# Patient Record
Sex: Female | Born: 2000 | Race: White | Hispanic: No | Marital: Single | Smoking: Never smoker
Health system: Southern US, Community
[De-identification: ages and names within clinical notes are randomized; demographics above are authoritative.]

## PROBLEM LIST (undated history)

## (undated) DIAGNOSIS — K219 Gastro-esophageal reflux disease without esophagitis: Secondary | ICD-10-CM

## (undated) DIAGNOSIS — G40909 Epilepsy, unspecified, not intractable, without status epilepticus: Secondary | ICD-10-CM

## (undated) DIAGNOSIS — Z8719 Personal history of other diseases of the digestive system: Secondary | ICD-10-CM

## (undated) DIAGNOSIS — F909 Attention-deficit hyperactivity disorder, unspecified type: Secondary | ICD-10-CM

## (undated) DIAGNOSIS — Z8711 Personal history of peptic ulcer disease: Secondary | ICD-10-CM

## (undated) DIAGNOSIS — Z8619 Personal history of other infectious and parasitic diseases: Secondary | ICD-10-CM

## (undated) DIAGNOSIS — R12 Heartburn: Secondary | ICD-10-CM

## (undated) HISTORY — DX: Epilepsy, unspecified, not intractable, without status epilepticus: G40.909

## (undated) HISTORY — DX: Attention-deficit hyperactivity disorder, unspecified type: F90.9

## (undated) HISTORY — DX: Personal history of peptic ulcer disease: Z87.11

## (undated) HISTORY — DX: Personal history of other diseases of the digestive system: Z87.19

## (undated) HISTORY — DX: Personal history of other infectious and parasitic diseases: Z86.19

## (undated) HISTORY — DX: Gastro-esophageal reflux disease without esophagitis: K21.9

## (undated) HISTORY — DX: Heartburn: R12

---
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
## 2018-01-30 ENCOUNTER — Ambulatory Visit (INDEPENDENT_AMBULATORY_CARE_PROVIDER_SITE_OTHER): Payer: Medicaid Other | Admitting: Family Medicine

## 2018-01-30 ENCOUNTER — Encounter: Payer: Self-pay | Admitting: Family Medicine

## 2018-01-30 DIAGNOSIS — K581 Irritable bowel syndrome with constipation: Secondary | ICD-10-CM | POA: Diagnosis not present

## 2018-01-30 MED ORDER — LUBIPROSTONE 8 MCG PO CAPS
8.0000 ug | ORAL_CAPSULE | Freq: Two times a day (BID) | ORAL | 1 refills | Status: DC
Start: 2018-01-30 — End: 2018-02-20

## 2018-01-30 NOTE — Progress Notes (Signed)
Patient: Jessica Hall Female    DOB: 2001-07-25   17 y.o.   MRN: [id number] Visit Date: 01/30/2018  Today's Provider: Mila Merry, MD   Chief Complaint  Patient presents with  . Abdominal Pain   Subjective:    Patient has had intermittent lower abdominal pain for about 2 months. Patient states usually after she eats, the pain will start in her lower abdominal region. Patient also has symptoms of gas, belching, diarrhea, constipation, and stomach cramping. Patient has gone off all her medications and now she's only taking otc stool softner.    Abdominal Pain  This is a new problem. The current episode started more than 1 month ago (2 months). The onset quality is gradual. The problem occurs constantly. The problem has been waxing and waning. The pain is located in the LLQ and RLQ. The pain is at a severity of 9/10. The pain is severe. The quality of the pain is sharp and cramping. The abdominal pain does not radiate. Associated symptoms include constipation, diarrhea and flatus. Pertinent negatives include no anorexia, arthralgias, belching, dysuria, fever, frequency, headaches, hematochezia, hematuria, melena, myalgias, nausea, vomiting or weight loss. The pain is aggravated by eating. The pain is relieved by bowel movements. Treatments tried: stool softner. The treatment provided mild relief. IBS  Had similar symptoms in 2017 ago attributed to IBS. She also had positive h. Pylori serology and was treated with triple therapy with some improvement in symptoms. She was also put on fiber supplement with minimal improvement.     No Known Allergies   Current Outpatient Medications:  .  amoxicillin (AMOXIL) 500 MG capsule, Take 2 capsules (1,000 mg total) by mouth 2 (two) times daily. (Patient not taking: Reported on 01/30/2018), Disp: 40 capsule, Rfl: 0 .  amphetamine-dextroamphetamine (ADDERALL XR) 15 MG 24 hr capsule, Take by mouth. Reported on 02/29/2016, Disp: , Rfl:  .   clarithromycin (BIAXIN) 500 MG tablet, Take 1 tablet (500 mg total) by mouth 2 (two) times daily. (Patient not taking: Reported on 01/30/2018), Disp: 20 tablet, Rfl: 0 .  clonazePAM (KLONOPIN) 0.5 MG tablet, Take by mouth. Reported on 02/29/2016, Disp: , Rfl:  .  omeprazole (PRILOSEC) 20 MG capsule, Take 1 capsule (20 mg total) by mouth daily. (Patient not taking: Reported on 01/30/2018), Disp: 20 capsule, Rfl: 0  Review of Systems  Constitutional: Negative for appetite change, chills, fatigue, fever and weight loss.  Respiratory: Negative for chest tightness and shortness of breath.   Cardiovascular: Negative for chest pain and palpitations.  Gastrointestinal: Positive for abdominal pain, constipation, diarrhea and flatus. Negative for anorexia, hematochezia, melena, nausea and vomiting.  Genitourinary: Negative for dysuria, frequency and hematuria.  Musculoskeletal: Negative for arthralgias and myalgias.  Neurological: Negative for dizziness, weakness and headaches.    Social History   Tobacco Use  . Smoking status: Never Smoker  . Smokeless tobacco: Never Used  Substance Use Topics  . Alcohol use: No    Alcohol/week: 0.0 oz   Objective:   BP 124/84 (BP Location: Right Arm, Patient Position: Sitting, Cuff Size: Normal)   Pulse 88   Temp 98.4 F (36.9 C) (Oral)   Resp 16   LMP 12/31/2017   SpO2 98%  Vitals:   01/30/18 1127  BP: 124/84  Pulse: 88  Resp: 16  Temp: 98.4 F (36.9 C)  TempSrc: Oral  SpO2: 98%     Physical Exam  General Appearance:    Alert, cooperative, no distress, obese  Eyes:    PERRL, conjunctiva/corneas clear, EOM's intact       Lungs:     Clear to auscultation bilaterally, respirations unlabored  Heart:    Regular rate and rhythm  Neurologic:   Awake, alert, oriented x 3. No apparent focal neurological           defect.           Assessment & Plan:     1. Irritable bowel syndrome with constipation  - lubiprostone (AMITIZA) 8 MCG capsule; Take  1 capsule (8 mcg total) by mouth 2 (two) times daily with a meal.  Dispense: 60 capsule; Refill: 1  Return in about 3 weeks (around 02/20/2018).       Mila Merryonald Kassady Laboy, MD  [facility]

## 2018-01-30 NOTE — Patient Instructions (Signed)
Irritable Bowel Syndrome, Adult Irritable bowel syndrome (IBS) is not one specific disease. It is a group of symptoms that affects the organs responsible for digestion (gastrointestinal or GI tract). To regulate how your GI tract works, your body sends signals back and forth between your intestines and your brain. If you have IBS, there Exton be a problem with these signals. As a result, your GI tract does not function normally. Your intestines Wyatt become more sensitive and overreact to certain things. This is especially true when you eat certain foods or when you are under stress. There are four types of IBS. These Musquiz be determined based on the consistency of your stool:  IBS with diarrhea.  IBS with constipation.  Mixed IBS.  Unsubtyped IBS.  It is important to know which type of IBS you have. Some treatments are more likely to be helpful for certain types of IBS. What are the causes? The exact cause of IBS is not known. What increases the risk? You Kiesel have a higher risk of IBS if:  You are a woman.  You are younger than 19 years old.  You have a family history of IBS.  You have mental health problems.  You have had bacterial infection of your GI tract.  What are the signs or symptoms? Symptoms of IBS vary from person to person. The main symptom is abdominal pain or discomfort. Additional symptoms usually include one or more of the following:  Diarrhea, constipation, or both.  Abdominal swelling or bloating.  Feeling full or sick after eating a small or regular-size meal.  Frequent gas.  Mucus in the stool.  A feeling of having more stool left after a bowel movement.  Symptoms tend to come and go. They Mccranie be associated with stress, psychiatric conditions, or nothing at all. How is this diagnosed? There is no specific test to diagnose IBS. Your health care provider will make a diagnosis based on a physical exam, medical history, and your symptoms. You Przybysz have other  tests to rule out other conditions that Villafranca be causing your symptoms. These Perot include:  Blood tests.  X-rays.  CT scan.  Endoscopy and colonoscopy. This is a test in which your GI tract is viewed with a long, thin, flexible tube.  How is this treated? There is no cure for IBS, but treatment can help relieve symptoms. IBS treatment often includes:  Changes to your diet, such as: ? Eating more fiber. ? Avoiding foods that cause symptoms. ? Drinking more water. ? Eating regular, medium-sized portioned meals.  Medicines. These Raval include: ? Fiber supplements if you have constipation. ? Medicine to control diarrhea (antidiarrheal medicines). ? Medicine to help control muscle spasms in your GI tract (antispasmodic medicines). ? Medicines to help with any mental health issues, such as antidepressants or tranquilizers.  Therapy. ? Talk therapy Farra help with anxiety, depression, or other mental health issues that can make IBS symptoms worse.  Stress reduction. ? Managing your stress can help keep symptoms under control.  Follow these instructions at home:  Take medicines only as directed by your health care provider.  Eat a healthy diet. ? Avoid foods and drinks with added sugar. ? Include more whole grains, fruits, and vegetables gradually into your diet. This Epperly be especially helpful if you have IBS with constipation. ? Avoid any foods and drinks that make your symptoms worse. These Lomas include dairy products and caffeinated or carbonated drinks. ? Do not eat large meals. ? Drink enough   fluid to keep your urine clear or pale yellow.  Exercise regularly. Ask your health care provider for recommendations of good activities for you.  Keep all follow-up visits as directed by your health care provider. This is important. Contact a health care provider if:  You have constant pain.  You have trouble or pain with swallowing.  You have worsening diarrhea. Get help right away  if:  You have severe and worsening abdominal pain.  You have diarrhea and: ? You have a rash, stiff neck, or severe headache. ? You are irritable, sleepy, or difficult to awaken. ? You are weak, dizzy, or extremely thirsty.  You have bright red blood in your stool or you have black tarry stools.  You have unusual abdominal swelling that is painful.  You vomit continuously.  You vomit blood (hematemesis).  You have both abdominal pain and a fever. This information is not intended to replace advice given to you by your health care provider. Make sure you discuss any questions you have with your health care provider. Document Released: 11/07/2005 Document Revised: 04/08/2016 Document Reviewed: 07/25/2014 Elsevier Interactive Patient Education  2018 Elsevier Inc.  

## 2018-02-02 DIAGNOSIS — K589 Irritable bowel syndrome without diarrhea: Secondary | ICD-10-CM | POA: Insufficient documentation

## 2018-02-20 ENCOUNTER — Encounter: Payer: Self-pay | Admitting: Family Medicine

## 2018-02-20 ENCOUNTER — Ambulatory Visit (INDEPENDENT_AMBULATORY_CARE_PROVIDER_SITE_OTHER): Payer: Medicaid Other | Admitting: Family Medicine

## 2018-02-20 VITALS — BP 120/80 | HR 50 | Temp 98.2°F | Resp 16 | Ht 65.0 in | Wt 163.0 lb

## 2018-02-20 DIAGNOSIS — K581 Irritable bowel syndrome with constipation: Secondary | ICD-10-CM

## 2018-02-20 DIAGNOSIS — Z1331 Encounter for screening for depression: Secondary | ICD-10-CM | POA: Diagnosis not present

## 2018-02-20 MED ORDER — LUBIPROSTONE 8 MCG PO CAPS: 8.0000 ug | ORAL_CAPSULE | Freq: Two times a day (BID) | ORAL | 1 refills | Status: DC

## 2018-02-20 MED ORDER — LUBIPROSTONE 8 MCG PO CAPS: 8.0000 ug | ORAL_CAPSULE | Freq: Two times a day (BID) | ORAL | 5 refills | Status: DC

## 2018-02-20 NOTE — Progress Notes (Signed)
       Patient: Jessica Hall Female    DOB: 09-07-2001   16 y.o.   MRN: [id number] Visit Date: 02/20/2018  Today's Provider: Mila Merryonald Uchechi Denison, MD   Chief Complaint  Patient presents with  . Follow-up   Subjective:    HPI  Irritable bowel syndrome with constipation From 01/30/2018-started lubiprostone (AMITIZA) 8 MCG capsule. Advised to return in about 3 weeks (around 02/20/2018) for follow up.  Patient states since starting Amitiza, she has been having regular bowel movements. Is not having any cramping. Feels that symptoms are completely controlled. Having BM once a day, no diarrhea.    No Known Allergies   Current Outpatient Medications:  .  amphetamine-dextroamphetamine (ADDERALL XR) 15 MG 24 hr capsule, Take by mouth. Reported on 02/29/2016, Disp: , Rfl:  .  clonazePAM (KLONOPIN) 0.5 MG tablet, Take by mouth. Reported on 02/29/2016, Disp: , Rfl:  .  Docusate Calcium (STOOL SOFTENER PO), Take by mouth., Disp: , Rfl:  .  lubiprostone (AMITIZA) 8 MCG capsule, Take 1 capsule (8 mcg total) by mouth 2 (two) times daily with a meal., Disp: 60 capsule, Rfl: 1 .  omeprazole (PRILOSEC) 20 MG capsule, Take 1 capsule (20 mg total) by mouth daily., Disp: 20 capsule, Rfl: 0  Review of Systems  Constitutional: Negative for appetite change, chills, fatigue and fever.  Respiratory: Negative for chest tightness and shortness of breath.   Cardiovascular: Negative for chest pain and palpitations.  Gastrointestinal: Negative for abdominal pain, nausea and vomiting.  Neurological: Negative for dizziness and weakness.    Social History   Tobacco Use  . Smoking status: Never Smoker  . Smokeless tobacco: Never Used  Substance Use Topics  . Alcohol use: No    Alcohol/week: 0.0 oz   Objective:   BP 120/80 (BP Location: Right Arm, Patient Position: Sitting, Cuff Size: Normal)   Pulse (!) 50   Temp 98.2 F (36.8 C) (Oral)   Resp 16   Ht 5\' 5"  (1.651 m)   Wt 163 lb (73.9 kg)   SpO2 98%   BMI  27.12 kg/m  Vitals:   02/20/18 1610  BP: 120/80  Pulse: (!) 50  Resp: 16  Temp: 98.2 F (36.8 C)  TempSrc: Oral  SpO2: 98%  Weight: 163 lb (73.9 kg)  Height: 5\' 5"  (1.651 m)     Physical Exam  General appearance: alert, well developed, well nourished, cooperative and in no distress Head: Normocephalic, without obvious abnormality, atraumatic Respiratory: Respirations even and unlabored, normal respiratory rate Extremities: No gross deformities Skin: Skin color, texture, turgor normal. No rashes seen  Psych: Appropriate mood and affect. Neurologic: Mental status: Alert, oriented to person, place, and time, thought content appropriate.     Assessment & Plan:     1. Irritable bowel syndrome with constipation Completely controlled since initiation of - lubiprostone (AMITIZA) 8 MCG capsule; Take 1 capsule (8 mcg total) by mouth 2 (two) times daily with a meal.  Dispense: 60 capsule; Refill: 5  Given 12 sample tablets to get by until prescription is approved/dispensed.   2. Positive depression screening She reports she does not feel anxious or depressed at all at this time. [facility]       Mila Merryonald Aubreigh Fuerte, MD  [facility]

## 2020-05-21 DIAGNOSIS — Z419 Encounter for procedure for purposes other than remedying health state, unspecified: Secondary | ICD-10-CM | POA: Diagnosis not present

## 2020-06-21 DIAGNOSIS — Z419 Encounter for procedure for purposes other than remedying health state, unspecified: Secondary | ICD-10-CM | POA: Diagnosis not present

## 2020-07-22 DIAGNOSIS — Z419 Encounter for procedure for purposes other than remedying health state, unspecified: Secondary | ICD-10-CM | POA: Diagnosis not present

## 2020-08-21 DIAGNOSIS — Z419 Encounter for procedure for purposes other than remedying health state, unspecified: Secondary | ICD-10-CM | POA: Diagnosis not present

## 2020-09-21 DIAGNOSIS — Z419 Encounter for procedure for purposes other than remedying health state, unspecified: Secondary | ICD-10-CM | POA: Diagnosis not present

## 2020-10-21 DIAGNOSIS — Z419 Encounter for procedure for purposes other than remedying health state, unspecified: Secondary | ICD-10-CM | POA: Diagnosis not present

## 2020-11-21 DIAGNOSIS — Z419 Encounter for procedure for purposes other than remedying health state, unspecified: Secondary | ICD-10-CM | POA: Diagnosis not present

## 2020-12-22 DIAGNOSIS — Z419 Encounter for procedure for purposes other than remedying health state, unspecified: Secondary | ICD-10-CM | POA: Diagnosis not present

## 2020-12-26 ENCOUNTER — Emergency Department: Payer: Medicaid Other

## 2020-12-26 ENCOUNTER — Other Ambulatory Visit: Payer: Self-pay

## 2020-12-26 ENCOUNTER — Emergency Department
Admission: EM | Admit: 2020-12-26 | Discharge: 2020-12-26 | Disposition: A | Discharge status: Home / Self Care | Payer: Medicaid Other | Attending: Emergency Medicine | Admitting: Emergency Medicine

## 2020-12-26 DIAGNOSIS — Z79899 Other long term (current) drug therapy: Secondary | ICD-10-CM | POA: Insufficient documentation

## 2020-12-26 DIAGNOSIS — M25562 Pain in left knee: Secondary | ICD-10-CM | POA: Insufficient documentation

## 2020-12-26 MED ORDER — MELOXICAM 15 MG PO TABS: 15.0000 mg | ORAL_TABLET | Freq: Every day | ORAL | 2 refills | Status: AC

## 2020-12-26 NOTE — Discharge Instructions (Signed)
Take Meloxicam once daily for pain and inflammation.  
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
## 2021-01-19 DIAGNOSIS — Z419 Encounter for procedure for purposes other than remedying health state, unspecified: Secondary | ICD-10-CM | POA: Diagnosis not present

## 2021-02-19 DIAGNOSIS — Z419 Encounter for procedure for purposes other than remedying health state, unspecified: Secondary | ICD-10-CM | POA: Diagnosis not present

## 2021-03-21 DIAGNOSIS — Z419 Encounter for procedure for purposes other than remedying health state, unspecified: Secondary | ICD-10-CM | POA: Diagnosis not present

## 2021-04-21 DIAGNOSIS — Z419 Encounter for procedure for purposes other than remedying health state, unspecified: Secondary | ICD-10-CM | POA: Diagnosis not present

## 2021-05-21 DIAGNOSIS — Z419 Encounter for procedure for purposes other than remedying health state, unspecified: Secondary | ICD-10-CM | POA: Diagnosis not present

## 2021-06-16 ENCOUNTER — Telehealth: Payer: Self-pay

## 2021-06-16 NOTE — Telephone Encounter (Signed)
Social worker called patient to provide her with the phone number for [facility] or to give her the option of her mother picking it up tomorrow, as previously planned.  Patient preferred to let her mother pick the phone number up tomorrow.

## 2021-06-17 ENCOUNTER — Ambulatory Visit: Payer: Medicaid Other
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
## 2022-05-11 ENCOUNTER — Telehealth: Payer: Self-pay

## 2022-05-11 ENCOUNTER — Ambulatory Visit: Payer: Self-pay

## 2022-05-11 NOTE — Telephone Encounter (Signed)
Pt called, she is wanting to get on birth control asap and she has seen other providers elsewhere and they advised her to wait unitl December but pt doesn't want to wait that long. Pt is requesting appt this month or early next month. Advised her d/t not being seen since 02/2018 she would be considered new patient again and appt avaialble in Sept 2023. Pt states she doesn't want to wait until then. Advised pt if she sets up Mychart she can do virtual UC visit today. Pt is unsure how to set up Mychart and asked if she can come by the office and someone assist her. I told her I'm sure someone could assist her and if she wanted to ask about getting appt sooner she could do that there as well but advised her I would send message back to practice. Pt states she will get her mom to bring her by this evening.   Summary: Needs birth Control with in the week   Pt states she has to get on birth control this month BFF told her it had been since 2019 since seen and was given [phone] does not know the name of the place but when called the # they do not do birth control, seeking how to get birth control with in this month. Offered appt with another provider but of course not available in time frame and has already spoke with office herself.  FU [phone].Any suggestions?

## 2022-05-11 NOTE — Telephone Encounter (Signed)
Copied from CRM [id number]. Topic: General - Other >> May 11, 2022  1:59 PM Pincus Sanes wrote: Reason for CRM: Message for Okey Regal, this pt wants to specifically talk to you. (Was calling this a.m. about Birth Control)  She states she can not come this afternoon but needs you to call her [phone]

## 2022-06-03 ENCOUNTER — Ambulatory Visit: Payer: Self-pay | Admitting: *Deleted

## 2022-06-03 NOTE — Progress Notes (Deleted)
      Established patient visit   Patient: Jessica Hall   DOB: 02/27/2001   21 y.o. Female  MRN: [id number] Visit Date: 06/06/2022  Today's healthcare provider: Mila Merry, MD   No chief complaint on file.  Subjective    HPI  Follow up for IBS:  The patient was last seen for this 4 years ago. Changes made at last visit include none; continue lubiprostone (AMITIZA) 8 MCG capsule; Take 1 capsule (8 mcg total) by mouth 2 (two) times daily with a meal.  She reports {excellent/good/fair/poor:19665} compliance with treatment. She feels that condition is {improved/worse/unchanged:3041574}. She {is/is not:21021397} having side effects. ***  -----------------------------------------------------------------------------------------   Medications: Outpatient Medications Prior to Visit  Medication Sig   amphetamine-dextroamphetamine (ADDERALL XR) 15 MG 24 hr capsule Take by mouth. Reported on 02/29/2016   clonazePAM (KLONOPIN) 0.5 MG tablet Take by mouth. Reported on 02/29/2016   Docusate Calcium (STOOL SOFTENER PO) Take by mouth.   lubiprostone (AMITIZA) 8 MCG capsule Take 1 capsule (8 mcg total) by mouth 2 (two) times daily with a meal.   omeprazole (PRILOSEC) 20 MG capsule Take 1 capsule (20 mg total) by mouth daily.   No facility-administered medications prior to visit.    Review of Systems  {Labs  Heme  Chem  Endocrine  Serology  Results Review (optional):23779}   Objective    There were no vitals taken for this visit. {Show previous vital signs (optional):23777}  Physical Exam  ***  No results found for any visits on 06/06/22.  Assessment & Plan     ***  No follow-ups on file.      {provider attestation***:1}   Mila Merry, MD  [facility] [phone] (phone) [phone] (fax)  [facility]

## 2022-06-03 NOTE — Telephone Encounter (Addendum)
  Chief Complaint: abdominal pain Symptoms: cramps with eating Frequency: everytime eats Pertinent Negatives: Patient denies fever, vomiting,  diarrhea Disposition: [] ED /[] Urgent Care (no appt availability in office) / [x] Appointment(In office/virtual)/ []  [place] [facility]/ [] Home Care/ [] Refused Recommended Disposition /[] [place] [facility]/ []  Follow-up with PCP Additional Notes: Pt given appt Mon 7/17. (She already has appt 7/19 for birth control and she was afraid to cancel it. I told her if it could not be addressed at Mondays visit keep it and if it is addressed she can cancel Wednesdays appt. while she is there. Hx of IBS but has been off meds for a long time.   Reason for Disposition  Abdominal pain is a chronic symptom (recurrent or ongoing AND present > 4 weeks)  Answer Assessment - Initial Assessment Questions 1. LOCATION: "Where does it hurt?"      Cramps in middle of abdomin sometimes higher up 2. RADIATION: "Does the pain shoot anywhere else?" (e.g., chest, back)     To lower back occassionally 3. ONSET: "When did the pain begin?" (e.g., minutes, hours or days ago)      2 days 4. SUDDEN: "Gradual or sudden onset?"     suddenly 5. PATTERN "Does the pain come and go, or is it constant?"    - If constant: "Is it getting better, staying the same, or worsening?"      (Note: Constant means the pain never goes away completely; most serious pain is constant and it progresses)     - If intermittent: "How long does it last?" "Do you have pain now?"     (Note: Intermittent means the pain goes away completely between bouts)     intermittent 6. SEVERITY: "How bad is the pain?"  (e.g., Scale 1-10; mild, moderate, or severe)   - MILD (1-3): doesn't interfere with normal activities, abdomen soft and not tender to touch    - MODERATE (4-7): interferes with normal activities or awakens from sleep, abdomen tender to touch    - SEVERE (8-10): excruciating pain, doubled over,  unable to do any normal activities      Pains after eats level 9 7. RECURRENT SYMPTOM: "Have you ever had this type of stomach pain before?" If Yes, ask: "When was the last time?" and "What happened that time?"      Feels like irritable bowel 8. CAUSE: "What do you think is causing the stomach pain?"     Irritable bowel 9. RELIEVING/AGGRAVATING FACTORS: "What makes it better or worse?" (e.g., movement, antacids, bowel movement)     Taking antiacid Mylanta and Kerapectake 10. OTHER SYMPTOMS: "Do you have any other symptoms?" (e.g., back pain, diarrhea, fever, urination pain, vomiting)       no 11. PREGNANCY: "Is there any chance you are pregnant?" "When was your last menstrual period?"       no  Protocols used: Abdominal Pain - [facility]

## 2022-06-05 ENCOUNTER — Telehealth: Payer: Self-pay | Admitting: Family Medicine

## 2022-06-05 DIAGNOSIS — Z01419 Encounter for gynecological examination (general) (routine) without abnormal findings: Secondary | ICD-10-CM

## 2022-06-05 DIAGNOSIS — Z30017 Encounter for initial prescription of implantable subdermal contraceptive: Secondary | ICD-10-CM

## 2022-06-05 NOTE — Telephone Encounter (Signed)
Pt would like to have a referral to GYN for Nexplanon since we are not able to do so. She has not current GYN

## 2022-06-06 ENCOUNTER — Ambulatory Visit: Payer: Medicaid Other | Admitting: Family Medicine

## 2022-06-06 NOTE — Telephone Encounter (Signed)
Pt informed to listen out for referral coordinator to contact her about GYN appt. She was under the impression she was having the Nexplanon insertion with Mardella Layman on Wednesday.   Pt voices understanding.  She will still see Mardella Layman for stomach issues.

## 2022-06-06 NOTE — Telephone Encounter (Signed)
Referral is placed per your request.

## 2022-06-08 ENCOUNTER — Ambulatory Visit: Payer: Medicaid Other | Admitting: Family Medicine

## 2022-06-08 ENCOUNTER — Encounter: Payer: Self-pay | Admitting: Physician Assistant

## 2022-06-08 ENCOUNTER — Ambulatory Visit (INDEPENDENT_AMBULATORY_CARE_PROVIDER_SITE_OTHER): Payer: Medicare Other | Admitting: Physician Assistant

## 2022-06-08 VITALS — BP 120/90 | HR 77 | Temp 98.6°F | Ht 64.0 in | Wt 198.7 lb

## 2022-06-08 DIAGNOSIS — R1013 Epigastric pain: Secondary | ICD-10-CM | POA: Diagnosis not present

## 2022-06-08 DIAGNOSIS — K5900 Constipation, unspecified: Secondary | ICD-10-CM

## 2022-06-08 DIAGNOSIS — K219 Gastro-esophageal reflux disease without esophagitis: Secondary | ICD-10-CM

## 2022-06-08 MED ORDER — OMEPRAZOLE 40 MG PO CPDR: 40.0000 mg | DELAYED_RELEASE_CAPSULE | Freq: Every day | ORAL | 1 refills | Status: DC

## 2022-06-08 NOTE — Progress Notes (Signed)
I,[name],acting as a Neurosurgeon for [employer], PA-C.,have documented all relevant documentation on the behalf of [name], PA-C,as directed by  [name], PA-C while in the presence of [name], PA-C.   Established patient visit   Patient: Jessica Hall   DOB: Rico 13, 2002   21 y.o. Female  MRN: [id number] Visit Date: 06/08/2022  Today's healthcare provider: [name], PA-C   Cc. Abdominal pain  Subjective      Jessica Hall is a 21 y/o female who presents today for abdominal pain. Previously carried the diagnosis of IBS and has been rx Amitiza. Pt reports her pain will come and go. Always triggered by spicy foods -- ie last week she had Salsa and the pain started. She appreciates the pain in her upper abdomen, associated with GERD, rates as a 8-9/10. Denies association with other foods. Eats gluten w/o issues. Denies change in BM.  Her BM are infrequent- ever 3-4 days, small amounts. Denies blood in stool. Pain can be relieved by BM.  Reports rarely drinking water. Has a serving of vegetables maybe once a week.  Last week with pain she added Mylanta which seemed to help. She is rx omeprazole but does not take consistently.   Mother is present today-- concerned about thyroid issues as she deals with these as well.   Medications: Outpatient Medications Prior to Visit  Medication Sig   amphetamine-dextroamphetamine (ADDERALL XR) 15 MG 24 hr capsule Take by mouth. Reported on 02/29/2016   clonazePAM (KLONOPIN) 0.5 MG tablet Take by mouth. Reported on 02/29/2016   Docusate Calcium (STOOL SOFTENER PO) Take by mouth.   lubiprostone (AMITIZA) 8 MCG capsule Take 1 capsule (8 mcg total) by mouth 2 (two) times daily with a meal.   [DISCONTINUED] omeprazole (PRILOSEC) 20 MG capsule Take 1 capsule (20 mg total) by mouth daily.   No facility-administered medications prior to visit.    Review of Systems  Gastrointestinal:  Positive for abdominal pain.      Objective    BP 120/90   Pulse 77   Temp 98.6 F (37 C) (Oral)   Ht 5\' 4"  (1.626 m)   Wt 198 lb 11.2 oz (90.1 kg)   SpO2 99%   BMI 34.11 kg/m  BP Readings from Last 3 Encounters:  06/08/22 120/90  12/26/20 131/89  02/20/18 120/80   Wt Readings from Last 3 Encounters:  06/08/22 198 lb 11.2 oz (90.1 kg)  12/26/20 220 lb (99.8 kg)  02/20/18 163 lb (73.9 kg) (90 %, Z= 1.29)*   * Growth percentiles are based on CDC (Girls, 2-20 Years) data.    Problem List Items Addressed This Visit       Other   Epigastric pain - Primary    Not convinced this is IBS Advised restarting omeprazole, rx 40 mg, first thing in AM on empty stomach. Will check cbc, cmp, hpylori test. For now, d/c amitiza      Relevant Medications   omeprazole (PRILOSEC) 40 MG capsule   Other Relevant Orders   CBC w/Diff/Platelet (Completed)   Comprehensive Metabolic Panel (CMET) (Completed)   Constipation    Increase fluid intake dramatically. Add in fiber-- either from food or fiber supplement like metamucil.  Will check tsh/t4      Relevant Orders   TSH + free T4   Other Visit Diagnoses     Gastroesophageal reflux disease, unspecified whether esophagitis present       Relevant Medications   omeprazole (PRILOSEC) 40 MG  capsule   Other Relevant Orders   CBC w/Diff/Platelet (Completed)   Comprehensive Metabolic Panel (CMET) (Completed)   H. pylori breath test       Return in about 6 weeks (around 07/20/2022) for abdominal pain.      I, [name], PA-C have reviewed all documentation for this visit. The documentation on 06/09/2022  for the exam, diagnosis, procedures, and orders are all accurate and complete.  [name], PA-C [facility] [address] [place], [place], [address] Office: [phone] Fax: [phone]   [facility]

## 2022-06-09 ENCOUNTER — Encounter: Payer: Self-pay | Admitting: Physician Assistant

## 2022-06-09 DIAGNOSIS — K5900 Constipation, unspecified: Secondary | ICD-10-CM | POA: Insufficient documentation

## 2022-06-09 DIAGNOSIS — R1013 Epigastric pain: Secondary | ICD-10-CM | POA: Insufficient documentation

## 2022-06-09 LAB — COMPREHENSIVE METABOLIC PANEL
ALT: 24 IU/L (ref 0–32)
AST: 26 IU/L (ref 0–40)
Albumin/Globulin Ratio: 1.7 (ref 1.2–2.2)
Albumin: 4.5 g/dL (ref 4.0–5.0)
Alkaline Phosphatase: 124 IU/L — ABNORMAL HIGH (ref 44–121)
BUN/Creatinine Ratio: 10 (ref 9–23)
BUN: 10 mg/dL (ref 6–20)
Bilirubin Total: 0.3 mg/dL (ref 0.0–1.2)
CO2: 25 mmol/L (ref 20–29)
Calcium: 9.6 mg/dL (ref 8.7–10.2)
Chloride: 102 mmol/L (ref 96–106)
Creatinine, Ser: 0.96 mg/dL (ref 0.57–1.00)
Globulin, Total: 2.7 g/dL (ref 1.5–4.5)
Glucose: 87 mg/dL (ref 70–99)
Potassium: 4.2 mmol/L (ref 3.5–5.2)
Sodium: 141 mmol/L (ref 134–144)
Total Protein: 7.2 g/dL (ref 6.0–8.5)
eGFR: 86 mL/min/{1.73_m2} (ref >59)

## 2022-06-09 LAB — CBC WITH DIFFERENTIAL/PLATELET
Basophils Absolute: 0.1 10*3/uL (ref 0.0–0.2)
Basos: 1 %
EOS (ABSOLUTE): 0.1 10*3/uL (ref 0.0–0.4)
Eos: 1 %
Hematocrit: 41.1 % (ref 34.0–46.6)
Hemoglobin: 13.1 g/dL (ref 11.1–15.9)
Immature Grans (Abs): 0 10*3/uL (ref 0.0–0.1)
Immature Granulocytes: 0 %
Lymphocytes Absolute: 1.9 10*3/uL (ref 0.7–3.1)
Lymphs: 18 %
MCH: 25.4 pg — ABNORMAL LOW (ref 26.6–33.0)
MCHC: 31.9 g/dL (ref 31.5–35.7)
MCV: 80 fL (ref 79–97)
Monocytes Absolute: 0.6 10*3/uL (ref 0.1–0.9)
Monocytes: 6 %
Neutrophils Absolute: 7.7 10*3/uL — ABNORMAL HIGH (ref 1.4–7.0)
Neutrophils: 74 %
Platelets: 368 10*3/uL (ref 150–450)
RBC: 5.16 x10E6/uL (ref 3.77–5.28)
RDW: 14 % (ref 11.7–15.4)
WBC: 10.4 10*3/uL (ref 3.4–10.8)

## 2022-06-09 LAB — TSH+FREE T4
Free T4: 1.23 ng/dL (ref 0.82–1.77)
TSH: 1.93 u[IU]/mL (ref 0.450–4.500)

## 2022-06-09 LAB — H. PYLORI BREATH TEST: H pylori Breath Test: NEGATIVE

## 2022-06-09 NOTE — Assessment & Plan Note (Addendum)
Increase fluid intake dramatically. Add in fiber-- either from food or fiber supplement like metamucil.  Will check tsh/t4

## 2022-06-09 NOTE — Assessment & Plan Note (Addendum)
Not convinced this is IBS Advised restarting omeprazole, rx 40 mg, first thing in AM on empty stomach. Will check cbc, cmp, hpylori test. For now, d/c amitiza

## 2022-07-15 ENCOUNTER — Encounter: Payer: Self-pay | Admitting: Physician Assistant

## 2022-07-15 ENCOUNTER — Ambulatory Visit (INDEPENDENT_AMBULATORY_CARE_PROVIDER_SITE_OTHER): Payer: Medicare Other | Admitting: Physician Assistant

## 2022-07-15 VITALS — BP 116/76 | HR 76 | Ht 64.0 in | Wt 199.6 lb

## 2022-07-15 DIAGNOSIS — Z3009 Encounter for other general counseling and advice on contraception: Secondary | ICD-10-CM | POA: Diagnosis not present

## 2022-07-15 DIAGNOSIS — K5900 Constipation, unspecified: Secondary | ICD-10-CM | POA: Diagnosis not present

## 2022-07-15 DIAGNOSIS — R1013 Epigastric pain: Secondary | ICD-10-CM | POA: Diagnosis not present

## 2022-07-15 NOTE — Assessment & Plan Note (Signed)
Pt still wants Nexplanon but was unable to schedule at [facility]  Will see if new provider can do it, otherwise will schedule with Dr B when she returns

## 2022-07-15 NOTE — Progress Notes (Signed)
I,[name],acting as a Neurosurgeon for [employer], PA-C.,have documented all relevant documentation on the behalf of Jessica Ferguson, PA-C,as directed by  Jessica Ferguson, PA-C while in the presence of Jessica Ferguson, PA-C.   Established patient visit   Patient: Jessica Hall   DOB: April 26, 2001   21 y.o. Female  MRN: [id number] Visit Date: 07/15/2022  Today's healthcare provider: [name], PA-C   Cc. Abdominal pain   Subjective    HPI   Follow up for Abdominal pain:  The patient was last seen for this 6 weeks ago. Changes made at last visit include: Advised restarting omeprazole, rx 40 mg, first thing in AM on empty stomach. Will check cbc, cmp, hpylori test. For now, d/c amitiza. She reports excellent compliance with treatment. She feels that condition is Improved. She is not having side effects.  - Patient reports medication is helping but has made her go to the restroom more often and if she eats spicy foods she still has the same pains, but improved. No pain at rest and pain after spicy foods is short lived. -----------------------------------------------------------------------------------------   Medications: Outpatient Medications Prior to Visit  Medication Sig   amphetamine-dextroamphetamine (ADDERALL XR) 15 MG 24 hr capsule Take by mouth. Reported on 02/29/2016   clonazePAM (KLONOPIN) 0.5 MG tablet Take by mouth. Reported on 02/29/2016   Docusate Calcium (STOOL SOFTENER PO) Take by mouth.   lubiprostone (AMITIZA) 8 MCG capsule Take 1 capsule (8 mcg total) by mouth 2 (two) times daily with a meal.   omeprazole (PRILOSEC) 40 MG capsule Take 1 capsule (40 mg total) by mouth daily.   No facility-administered medications prior to visit.    Review of Systems  Constitutional:  Negative for appetite change, chills, fatigue and fever.  Respiratory:  Negative for chest tightness and shortness of breath.   Cardiovascular:  Negative for chest pain and palpitations.   Gastrointestinal:  Negative for abdominal pain, nausea and vomiting.  Neurological:  Negative for dizziness and weakness.      Blood pressure 116/76, pulse 76, height 5\' 4"  (1.626 m), weight 199 lb 9.6 oz (90.5 kg), SpO2 99 %.   Physical Exam Vitals reviewed.  Constitutional:      Appearance: She is not ill-appearing.  HENT:     Head: Normocephalic.  Eyes:     Conjunctiva/sclera: Conjunctivae normal.  Cardiovascular:     Rate and Rhythm: Normal rate.  Pulmonary:     Effort: Pulmonary effort is normal. No respiratory distress.  Abdominal:     General: Abdomen is flat. There is no distension.     Palpations: Abdomen is soft.     Tenderness: There is no abdominal tenderness. There is no guarding or rebound.  Neurological:     General: No focal deficit present.     Mental Status: She is alert and oriented to person, place, and time.  Psychiatric:        Mood and Affect: Mood normal.        Behavior: Behavior normal.      No results found for any visits on 07/15/22.  Assessment & Plan     Problem List Items Addressed This Visit       Other   Epigastric pain - Primary    Had started omeprazole, hpylori and labs were negative Reports improvement in pain, now only with spicy foods Will f/u 8 weeks but placing ref to GI now d/t prolonged nature in getting appts if her symptoms do not improve  recommending endoscopy      Relevant Orders   Ambulatory referral to Gastroenterology   Constipation    Had recommended fiber and increased fluids; pt is able to go daily now      Encounter for counseling regarding contraception    Pt still wants Nexplanon but was unable to schedule at [facility]  Will see if new provider can do it, otherwise will schedule with Dr B when she returns      Pt is not sexually active and would like to hold off on paps for now.  Return in about 8 weeks (around 09/09/2022) for abdominal pain.      I, Jessica Ferguson, PA-C have reviewed all  documentation for this visit. The documentation on  07/15/2022 for the exam, diagnosis, procedures, and orders are all accurate and complete.  Jessica Ferguson, PA-C [facility] [address] [place], [place], [address] Office: [phone] Fax: [phone]   [facility]

## 2022-07-15 NOTE — Assessment & Plan Note (Addendum)
Had started omeprazole, hpylori and labs were negative Reports improvement in pain, now only with spicy foods Will f/u 8 weeks but placing ref to GI now d/t prolonged nature in getting appts if her symptoms do not improve recommending endoscopy

## 2022-07-15 NOTE — Assessment & Plan Note (Addendum)
Had recommended fiber and increased fluids; pt is able to go daily now

## 2022-07-29 ENCOUNTER — Telehealth: Payer: Self-pay

## 2022-07-29 NOTE — Telephone Encounter (Signed)
-----   Message from Ronnald Ramp, MD sent at 07/27/2022  4:02 PM EDT ----- Yes, I'd be happy to  Dr. Roxan Hockey ----- Message ----- From: Acey Lav, CMA Sent: 07/27/2022   2:33 PM EDT To: Ronnald Ramp, MD  Afternoon,   Would you be able to insert nexplanon for the following patient? Please advise. ----- Message ----- From: [name], PA-C Sent: 07/15/2022   3:39 PM EDT To: Acey Lav, CMA  Reminder, can you ask the new docs about nexplanon for this patient ? Since I wont be here next week

## 2022-07-29 NOTE — Telephone Encounter (Signed)
Patient scheduled for Wednesday September 13,2023 at 4 pm

## 2022-08-02 NOTE — Patient Instructions (Incomplete)
Patient Information: Nexplanon insertion  Today you received a NEXPLANON insertion for contraception.   Please remember: you should use a back up method of contraception for 7 days after insertion   Also remember that nexplanon does not protect you from sexually transmitted infections (STI) so additional protection barrier for STI such as condoms should be considered in conjunction with the implant.  Please leave the bandage in place for 24 hours.   Notify our office if you experience increased redness, swelling or pain at the insertion site.   You will need URGENT evaluation if you experience shortness of breath or chest pain.

## 2022-08-03 ENCOUNTER — Ambulatory Visit (INDEPENDENT_AMBULATORY_CARE_PROVIDER_SITE_OTHER): Payer: Medicare Other | Admitting: Family Medicine

## 2022-08-03 ENCOUNTER — Encounter: Payer: Self-pay | Admitting: *Deleted

## 2022-08-03 ENCOUNTER — Encounter: Payer: Self-pay | Admitting: Family Medicine

## 2022-08-03 VITALS — BP 133/87 | HR 89 | Temp 98.5°F | Resp 16 | Ht 64.0 in | Wt 199.0 lb

## 2022-08-03 DIAGNOSIS — Z30017 Encounter for initial prescription of implantable subdermal contraceptive: Secondary | ICD-10-CM

## 2022-08-03 NOTE — Assessment & Plan Note (Signed)
PROCEDURE NOTE: Nexplanon insertion  Patient given informed consent, signed copy in the chart.  Appropriate time out taken   Pregnancy test was negative.  The patient's right arm was prepped and draped in the usual sterile fashion. The ruler used to measure and mark the insertion area 8 cm from medial epicondyle of the elbow. Local anaesthesia obtained using 1.5 cc of 1% lidocaine with epinephrine. Nexplanon was inserted per manufacturer's directions. Less than 1 cc blood loss. The insertion site covered with with a pressure bandage to minimize bruising. There were no complications and the patient tolerated the procedure well. Patient is informed the removal date will be in three years.

## 2022-08-03 NOTE — Progress Notes (Signed)
    SUBJECTIVE:   CHIEF COMPLAINT / HPI:   Jessica Hall presents for nexplanon insertion procedure for contraception She is agreeable to urine pregnancy testing She is currently on day 5 of her menses  She has not been sexually active  She has used no prior method of contraception   OBJECTIVE:   BP 133/87 (BP Location: Right Arm, Patient Position: Sitting, Cuff Size: Normal)   Pulse 89   Temp 98.5 F (36.9 C)   Resp 16   Ht 5\' 4"  (1.626 m)   Wt 199 lb (90.3 kg)   SpO2 99%   BMI 34.16 kg/m   Physical Exam Constitutional:      General: She is not in acute distress.    Appearance: Normal appearance. She is not ill-appearing or diaphoretic.  Pulmonary:     Effort: Pulmonary effort is normal. No respiratory distress.  Neurological:     Mental Status: She is alert.      ASSESSMENT/PLAN:   Nexplanon insertion PROCEDURE NOTE: Nexplanon insertion  Patient given informed consent, signed copy in the chart.  Appropriate time out taken   Pregnancy test was negative.  The patient's right arm was prepped and draped in the usual sterile fashion. The ruler used to measure and mark the insertion area 8 cm from medial epicondyle of the elbow. Local anaesthesia obtained using 1.5 cc of 1% lidocaine with epinephrine. Nexplanon was inserted per manufacturer's directions. Less than 1 cc blood loss. The insertion site covered with with a pressure bandage to minimize bruising. There were no complications and the patient tolerated the procedure well. Patient is informed the removal date will be in three years.      , MD [facility]

## 2022-09-09 ENCOUNTER — Ambulatory Visit (INDEPENDENT_AMBULATORY_CARE_PROVIDER_SITE_OTHER): Payer: Medicare Other | Admitting: Family Medicine

## 2022-09-09 ENCOUNTER — Encounter: Payer: Self-pay | Admitting: Family Medicine

## 2022-09-09 VITALS — BP 114/82 | HR 83 | Temp 98.4°F | Wt 197.0 lb

## 2022-09-09 DIAGNOSIS — F900 Attention-deficit hyperactivity disorder, predominantly inattentive type: Secondary | ICD-10-CM | POA: Diagnosis not present

## 2022-09-09 DIAGNOSIS — R1013 Epigastric pain: Secondary | ICD-10-CM | POA: Diagnosis not present

## 2022-09-09 DIAGNOSIS — F419 Anxiety disorder, unspecified: Secondary | ICD-10-CM

## 2022-09-09 MED ORDER — AMPHETAMINE-DEXTROAMPHET ER 10 MG PO CP24: 10.0000 mg | ORAL_CAPSULE | Freq: Every day | ORAL | 0 refills | Status: DC

## 2022-09-09 MED ORDER — CLONAZEPAM 0.5 MG PO TABS: 0.5000 mg | ORAL_TABLET | Freq: Every day | ORAL | 1 refills | Status: DC

## 2022-09-09 NOTE — Progress Notes (Signed)
I,Roshena L Chambers,acting as a scribe for Lelon Huh, MD.,have documented all relevant documentation on the behalf of Lelon Huh, MD,as directed by  Lelon Huh, MD while in the presence of Lelon Huh, MD.    Established patient visit   Patient: Jessica Hall   DOB: 2001/05/17   21 y.o. Female  MRN: [id number] Visit Date: 09/09/2022  Today's healthcare provider: Lelon Huh, MD   Chief Complaint  Patient presents with   Abdominal Pain   Follow-up   Subjective    HPI  Follow up for abdominal pain:  The patient was last seen for this 2 months ago. Changes made at last visit include referring to GI. She had extensive normal labs including negative h. Pylori breath test. She was prescribed omeprazole and states pain mostly resolved so long as she takes omeprazole consistently. Appointment with GI is scheduled for 11/29/2022.   She reports good compliance with treatment.   -----------------------------------------------------------------------------------------  She also has established of anxiety, depression and ADD for which she was prescribed clonazepam and Adderall years ago, but has not taken since graduating from high school in 2019. Since then she has not worked regularly and not going to [employer]. 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 She feels her anxiety and poor concentration is making it impossible to hold a job and wishes to restart those medications.    Medications: Outpatient Medications Prior to Visit  Medication Sig Note   Docusate Calcium (STOOL SOFTENER PO) Take by mouth.    omeprazole (PRILOSEC) 40 MG capsule Take 1 capsule (40 mg total) by mouth daily.    [DISCONTINUED] lubiprostone (AMITIZA) 8 MCG capsule Take 1 capsule (8 mcg total) by mouth 2 (two) times daily with a meal. (Patient not taking: Reported on  09/09/2022)    No facility-administered medications prior to visit.    Review of Systems  Constitutional:  Negative for appetite change, chills, fatigue and fever.  Respiratory:  Negative for chest tightness and shortness of breath.   Cardiovascular:  Negative for chest pain and palpitations.  Gastrointestinal:  Negative for abdominal pain, nausea and vomiting.  Neurological:  Negative for dizziness and weakness.       Objective    BP 114/82 (BP Location: Left Arm, Patient Position: Sitting, Cuff Size: Large)   Pulse 83   Temp 98.4 F (36.9 C) (Oral)   Wt 197 lb (89.4 kg)   SpO2 98% Comment: room air  BMI 33.81 kg/m    Physical Exam   General: Appearance:    Obese female in no acute distress  Eyes:    PERRL, conjunctiva/corneas clear, EOM's intact       Lungs:     Clear to auscultation bilaterally, respirations unlabored  Heart:    Normal heart rate. Normal rhythm. No murmurs, rubs, or gallops.    MS:   All extremities are intact.    Neurologic:   Awake, alert, oriented x 3. No apparent focal neurological defect.         Assessment & Plan     1. Attention deficit hyperactivity disorder (ADHD), predominantly inattentive type restart amphetamine-dextroamphetamine (ADDERALL XR) 10 MG 24 hr capsule; Take 1 capsule (10 mg total) by mouth daily.  Dispense: 30 capsule; Refill: 0  2. Anxiety restart clonazePAM (KLONOPIN) 0.5 MG tablet; Take 1 tablet (0.5 mg total) by mouth daily.  Dispense:  30 tablet; Refill: 1  Is overall rendered unemployable from anxiety and poor attention. Did well with above medications years ago, which we are restarting today. Is to follow up on medications in  4 weeks. Consider referral for counseling. Will discuss further at follow up appt.   3. Epigastric pain Likely gastritis which Hallas be related anxiety. H. Pylori was negative. Is fairly well controlled on omeprazole      The entirety of the information documented in the History of Present  Illness, Review of Systems and Physical Exam were personally obtained by me. Portions of this information were initially documented by the CMA and reviewed by me for thoroughness and accuracy.     Lelon Huh, MD  [facility] [phone] (phone) [phone] (fax)  [place]

## 2022-10-10 ENCOUNTER — Ambulatory Visit (INDEPENDENT_AMBULATORY_CARE_PROVIDER_SITE_OTHER): Payer: Medicare Other | Admitting: Family Medicine

## 2022-10-10 ENCOUNTER — Encounter: Payer: Self-pay | Admitting: Family Medicine

## 2022-10-10 VITALS — HR 86 | Temp 98.6°F | Wt 196.0 lb

## 2022-10-10 DIAGNOSIS — R1013 Epigastric pain: Secondary | ICD-10-CM | POA: Diagnosis not present

## 2022-10-10 DIAGNOSIS — F900 Attention-deficit hyperactivity disorder, predominantly inattentive type: Secondary | ICD-10-CM | POA: Diagnosis not present

## 2022-10-10 DIAGNOSIS — F419 Anxiety disorder, unspecified: Secondary | ICD-10-CM

## 2022-10-10 MED ORDER — AMPHETAMINE-DEXTROAMPHET ER 10 MG PO CP24: 10.0000 mg | ORAL_CAPSULE | Freq: Every day | ORAL | 0 refills | Status: DC

## 2022-10-10 MED ORDER — OMEPRAZOLE 20 MG PO CPDR
20.0000 mg | DELAYED_RELEASE_CAPSULE | Freq: Every day | ORAL | 1 refills | Status: DC
Start: 2022-10-10 — End: 2022-11-24

## 2022-10-10 NOTE — Progress Notes (Signed)
I,Roshena L Chambers,acting as a scribe for Mila Merry, MD.,have documented all relevant documentation on the behalf of Mila Merry, MD,as directed by  Mila Merry, MD while in the presence of Mila Merry, MD.   Established patient visit   Patient: Jessica Hall   DOB: 2001/08/08   21 y.o. Female  MRN: [id number] Visit Date: 10/10/2022  Today's healthcare provider: Mila Merry, MD   Chief Complaint  Patient presents with   Follow-up   Subjective    HPI  Follow up for ADHD:  The patient was last seen for this 1  month  ago. Changes made at last visit include restarting Adderall XR.  She reports good compliance with treatment. She feels that condition is Improved. She is not having side effects.  She feels much more focused. She is scheduled to have interview at [employer]. 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  -----------------------------------------------------------------------------------------   Follow up for anxiety:  The patient was last seen for this 1  month  ago. Changes made at last visit include restarting Clonazepam.  She reports good compliance with treatment. She feels that condition is Improved. She is not having side effects.  She only takes clonazepam when she feels like she is going to have a panic attack, which typically only occurs when be around large crowds.   -----------------------------------------------------------------------------------------     10/10/2022    3:08 PM  GAD 7 : Generalized Anxiety Score  Nervous, Anxious, on Edge 2  Control/stop worrying 0  Worry too much - different things 0  Trouble relaxing 0  Restless 0  Easily annoyed or irritable 0  Afraid - awful might happen 0  Total GAD 7 Score 2  Anxiety Difficulty Not difficult at all    Epigastric pain She continues on omeprazole 40mg  and states she only has pain when she eats spicy foods. She had negative labs including  negative h. Pylori test in July. No heartburn. No bowel changes.      10/10/2022    3:07 PM 09/09/2022    5:01 PM 08/03/2022    3:35 PM  Depression screen PHQ 2/9  Decreased Interest 0 0 0  Down, Depressed, Hopeless 0 1 0  PHQ - 2 Score 0 1 0  Altered sleeping 0 1 0  Tired, decreased energy 0 1   Change in appetite 1 1 0  Feeling bad or failure about yourself  0 1 0  Trouble concentrating 0 0 0  Moving slowly or fidgety/restless 0 0 0  Suicidal thoughts 0 0 0  PHQ-9 Score 1 5 0  Difficult doing work/chores Not difficult at all Not difficult at all Not difficult at all     Medications: Outpatient Medications Prior to Visit  Medication Sig   amphetamine-dextroamphetamine (ADDERALL XR) 10 MG 24 hr capsule Take 1 capsule (10 mg total) by mouth daily.   clonazePAM (KLONOPIN) 0.5 MG tablet Take 1 tablet (0.5 mg total) by mouth daily.   Docusate Calcium (STOOL SOFTENER PO) Take by mouth.   omeprazole (PRILOSEC) 40 MG capsule Take 1 capsule (40 mg total) by mouth daily.   No facility-administered medications prior to visit.    Review of Systems  Constitutional:  Negative for appetite change, chills, fatigue and fever.  Respiratory:  Negative for chest tightness and shortness of breath.   Cardiovascular:  Negative for chest pain and palpitations.  Gastrointestinal:  Negative for abdominal pain, nausea and vomiting.  Neurological:  Negative for dizziness  and weakness.       Objective    Pulse 86   Temp 98.6 F (37 C) (Oral)   Wt 196 lb (88.9 kg)   SpO2 100% Comment: room air  BMI 33.64 kg/m    Physical Exam  General appearance: Obese female, cooperative and in no acute distress Head: Normocephalic, without obvious abnormality, atraumatic Respiratory: Respirations even and unlabored, normal respiratory rate Extremities: All extremities are intact.  Skin: Skin color, texture, turgor normal. No rashes seen  Psych: Appropriate mood and affect. Neurologic: Mental status:  Alert, oriented to person, place, and time, thought content appropriate.   Assessment & Plan     1. Attention deficit hyperactivity disorder (ADHD), predominantly inattentive type Doing better since restarting low dose of Adderall. Has interview at [employer] tomorrow.  refill amphetamine-dextroamphetamine (ADDERALL XR) 10 MG 24 hr capsule; Take 1 capsule (10 mg total) by mouth daily.  Dispense: 30 capsule; Refill: 0  2. Anxiety Taking clonazepam only occasionally, usually only when she is going to be around crowds of people.   3. Epigastric pain Is doing much better, only has symptoms with spicy foods which she now avoiding. Try reducing omeprazole (PRILOSEC) to 20 MG capsule; Take 1 capsule (20 mg total) by mouth daily.  Dispense: 90 capsule; Refill: 1  Has appt with Dr. Tobi Bastos in January.        The entirety of the information documented in the History of Present Illness, Review of Systems and Physical Exam were personally obtained by me. Portions of this information were initially documented by the CMA and reviewed by me for thoroughness and accuracy.     Mila Merry, MD  [facility] [phone] (phone) [phone] (fax)  [facility]

## 2022-10-10 NOTE — Patient Instructions (Signed)
.   Please review the attached list of medications and notify my office if there are any errors.   . Please bring all of your medications to every appointment so we can make sure that our medication list is the same as yours.   

## 2022-10-25 ENCOUNTER — Ambulatory Visit (INDEPENDENT_AMBULATORY_CARE_PROVIDER_SITE_OTHER): Payer: Medicare Other

## 2022-10-25 ENCOUNTER — Encounter (INDEPENDENT_AMBULATORY_CARE_PROVIDER_SITE_OTHER): Payer: Medicare Other | Admitting: Family Medicine

## 2022-10-25 VITALS — Ht 64.0 in | Wt 196.0 lb

## 2022-10-25 DIAGNOSIS — Z0000 Encounter for general adult medical examination without abnormal findings: Secondary | ICD-10-CM
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
## 2022-11-03 ENCOUNTER — Ambulatory Visit: Payer: Medicare Other | Admitting: Physician Assistant

## 2022-11-03 NOTE — Progress Notes (Deleted)
     I,Jaquavion Mccannon R Arber Wiemers,acting as a Neurosurgeon for [employer], PA-C.,have documented all relevant documentation on the behalf of [name], PA-C,as directed by  [name], PA-C while in the presence of [name], PA-C.   Established patient visit   Patient: Jessica Hall   DOB: 2001/02/20   21 y.o. Female  MRN: [id number] Visit Date: 11/03/2022  Today's healthcare provider: [name], PA-C   No chief complaint on file.  Subjective    HPI  Fever Blisters    -----------------------------------------------------------------------------------------   Medications: Outpatient Medications Prior to Visit  Medication Sig   amphetamine-dextroamphetamine (ADDERALL XR) 10 MG 24 hr capsule Take 1 capsule (10 mg total) by mouth daily.   clonazePAM (KLONOPIN) 0.5 MG tablet Take 1 tablet (0.5 mg total) by mouth daily.   Docusate Calcium (STOOL SOFTENER PO) Take by mouth.   omeprazole (PRILOSEC) 20 MG capsule Take 1 capsule (20 mg total) by mouth daily.   No facility-administered medications prior to visit.    Review of Systems  {Labs  Heme  Chem  Endocrine  Serology  Results Review (optional):23779}   Objective    There were no vitals taken for this visit. {Show previous vital signs (optional):23777}  Physical Exam  ***  No results found for any visits on 11/03/22.  Assessment & Plan     ***  No follow-ups on file.      {provider attestation***:1}   [name], PA-C  [facility] [phone] (phone) [phone] (fax)  [facility]

## 2022-11-08 ENCOUNTER — Ambulatory Visit: Payer: Medicare Other | Admitting: Physician Assistant

## 2022-11-08 NOTE — Progress Notes (Deleted)
     I,Sha'taria Jochebed Bills,acting as a Neurosurgeon for [employer], PA-C.,have documented all relevant documentation on the behalf of [name], PA-C,as directed by  [name], PA-C while in the presence of [name], PA-C.   Established patient visit   Patient: Jessica Hall   DOB: [birth year]   21 y.o. Female  MRN: [id number] Visit Date: 11/08/2022  Today's healthcare provider: [name], PA-C   No chief complaint on file.  Subjective    HPI  Patient is being seen due to fever blister that is present.   Medications: Outpatient Medications Prior to Visit  Medication Sig   amphetamine-dextroamphetamine (ADDERALL XR) 10 MG 24 hr capsule Take 1 capsule (10 mg total) by mouth daily.   clonazePAM (KLONOPIN) 0.5 MG tablet Take 1 tablet (0.5 mg total) by mouth daily.   Docusate Calcium (STOOL SOFTENER PO) Take by mouth.   omeprazole (PRILOSEC) 20 MG capsule Take 1 capsule (20 mg total) by mouth daily.   No facility-administered medications prior to visit.    Review of Systems  {Labs  Heme  Chem  Endocrine  Serology  Results Review (optional):23779}   Objective    There were no vitals taken for this visit. {Show previous vital signs (optional):23777}  Physical Exam  ***  No results found for any visits on 11/08/22.  Assessment & Plan     ***  No follow-ups on file.      {provider attestation***:1}   [name], PA-C  [facility] [phone] (phone) [phone] (fax)  [facility]

## 2022-11-18 ENCOUNTER — Other Ambulatory Visit: Payer: Self-pay | Admitting: Physician Assistant

## 2022-11-18 DIAGNOSIS — R1013 Epigastric pain: Secondary | ICD-10-CM

## 2022-11-18 DIAGNOSIS — K219 Gastro-esophageal reflux disease without esophagitis: Secondary | ICD-10-CM

## 2022-11-22 NOTE — Telephone Encounter (Signed)
Erroneous encounter

## 2022-11-24 ENCOUNTER — Ambulatory Visit (INDEPENDENT_AMBULATORY_CARE_PROVIDER_SITE_OTHER): Payer: Medicare Other | Admitting: Family Medicine

## 2022-11-24 ENCOUNTER — Encounter: Payer: Self-pay | Admitting: Family Medicine

## 2022-11-24 VITALS — BP 105/72 | HR 94 | Temp 98.8°F | Resp 16 | Wt 200.1 lb

## 2022-11-24 DIAGNOSIS — R509 Fever, unspecified: Secondary | ICD-10-CM | POA: Diagnosis not present

## 2022-11-24 DIAGNOSIS — K2100 Gastro-esophageal reflux disease with esophagitis, without bleeding: Secondary | ICD-10-CM | POA: Diagnosis not present

## 2022-11-24 DIAGNOSIS — J101 Influenza due to other identified influenza virus with other respiratory manifestations: Secondary | ICD-10-CM | POA: Diagnosis not present

## 2022-11-24 LAB — POCT INFLUENZA A/B
Influenza A, POC: POSITIVE — AB
Influenza B, POC: NEGATIVE

## 2022-11-24 MED ORDER — OSELTAMIVIR PHOSPHATE 75 MG PO CAPS: 75.0000 mg | ORAL_CAPSULE | Freq: Two times a day (BID) | ORAL | 0 refills | Status: DC

## 2022-11-24 MED ORDER — OMEPRAZOLE 40 MG PO CPDR: 40.0000 mg | DELAYED_RELEASE_CAPSULE | Freq: Every day | ORAL | 3 refills | Status: DC

## 2022-11-24 NOTE — Assessment & Plan Note (Signed)
Unknown cause; home COVID negative, no sick contacts POCT flu in office positive, Flu A

## 2022-11-24 NOTE — Assessment & Plan Note (Signed)
Acute, self limiting Fevers to 101.61F Complaints of cough with rhinorrhea, clear d/c No known contacts Wishes to use tamiflu to assist

## 2022-11-24 NOTE — Progress Notes (Signed)
I,Joseline E Rosas,acting as a scribe for Gwyneth Sprout, FNP.,have documented all relevant documentation on the behalf of Gwyneth Sprout, FNP,as directed by  Gwyneth Sprout, FNP while in the presence of Gwyneth Sprout, FNP.   Established patient visit   Patient: Jessica Hall   DOB: May 09, 2001   22 y.o. Female  MRN: [id number] Visit Date: 11/24/2022  Today's healthcare provider: Gwyneth Sprout, FNP  Introduced to nurse practitioner role and practice setting.  All questions answered.  Discussed provider/patient relationship and expectations.   Chief Complaint  Patient presents with   URI   Subjective    URI  This is a new problem. The current episode started yesterday. The problem has been unchanged. The maximum temperature recorded prior to her arrival was 101 - 101.9 F (last night it was 102). Associated symptoms include coughing and rhinorrhea. Pertinent negatives include no congestion, ear pain, headaches, joint pain, nausea, sneezing, sore throat or vomiting. She has tried acetaminophen for the symptoms.    Patient did a Covid test at today-Negative  Medications: Outpatient Medications Prior to Visit  Medication Sig   amphetamine-dextroamphetamine (ADDERALL XR) 10 MG 24 hr capsule Take 1 capsule (10 mg total) by mouth daily.   clonazePAM (KLONOPIN) 0.5 MG tablet Take 1 tablet (0.5 mg total) by mouth daily.   Docusate Calcium (STOOL SOFTENER PO) Take by mouth.   [DISCONTINUED] omeprazole (PRILOSEC) 20 MG capsule Take 1 capsule (20 mg total) by mouth daily.   No facility-administered medications prior to visit.    Review of Systems  HENT:  Positive for rhinorrhea. Negative for congestion, ear pain, sneezing and sore throat.   Respiratory:  Positive for cough.   Gastrointestinal:  Negative for nausea and vomiting.  Musculoskeletal:  Negative for joint pain.  Neurological:  Negative for headaches.     Objective    BP 105/72 (BP Location: Left Arm, Patient Position:  Sitting, Cuff Size: Normal)   Pulse 94   Temp 98.8 F (37.1 C) (Oral)   Resp 16   Wt 200 lb 1.6 oz (90.8 kg)   SpO2 98%   BMI 34.35 kg/m   Physical Exam Vitals and nursing note reviewed.  Constitutional:      General: She is not in acute distress.    Appearance: Normal appearance. She is obese. She is not ill-appearing, toxic-appearing or diaphoretic.  HENT:     Head: Normocephalic and atraumatic.     Right Ear: Tympanic membrane, ear canal and external ear normal.     Left Ear: Tympanic membrane, ear canal and external ear normal.     Nose: Rhinorrhea present.     Mouth/Throat:     Mouth: Mucous membranes are moist.     Pharynx: Oropharynx is clear. No oropharyngeal exudate or posterior oropharyngeal erythema.  Cardiovascular:     Rate and Rhythm: Normal rate and regular rhythm.     Pulses: Normal pulses.     Heart sounds: Normal heart sounds. No murmur heard.    No friction rub. No gallop.  Pulmonary:     Effort: Pulmonary effort is normal. No respiratory distress.     Breath sounds: Normal breath sounds. No stridor. No wheezing, rhonchi or rales.  Chest:     Chest wall: No tenderness.  Abdominal:     General: Bowel sounds are normal.     Palpations: Abdomen is soft.     Comments: Worsening GERD with change from PPI, prilosec 40 mg to 20  mg; will restart at 40 mg. OK to use current 20 mg supply to finish with 2 capsules/day  Musculoskeletal:        General: No swelling, tenderness, deformity or signs of injury. Normal range of motion.     Right lower leg: No edema.     Left lower leg: No edema.  Skin:    General: Skin is warm and dry.     Capillary Refill: Capillary refill takes less than 2 seconds.     Coloration: Skin is not jaundiced or pale.     Findings: No bruising, erythema, lesion or rash.  Neurological:     General: No focal deficit present.     Mental Status: She is alert and oriented to person, place, and time. Mental status is at baseline.     Cranial  Nerves: No cranial nerve deficit.     Sensory: No sensory deficit.     Motor: No weakness.     Coordination: Coordination normal.  Psychiatric:        Mood and Affect: Mood normal.        Behavior: Behavior normal.        Thought Content: Thought content normal.        Judgment: Judgment normal.     Results for orders placed or performed in visit on 11/24/22  POCT Influenza A/B  Result Value Ref Range   Influenza A, POC Positive (A) Negative   Influenza B, POC Negative Negative    Assessment & Plan     Problem List Items Addressed This Visit       Respiratory   Influenza A    Acute, self limiting Fevers to 101.77F Complaints of cough with rhinorrhea, clear d/c No known contacts Wishes to use tamiflu to assist       Relevant Medications   oseltamivir (TAMIFLU) 75 MG capsule     Digestive   Gastroesophageal reflux disease with esophagitis without hemorrhage    Chronic, worsening since decrease in strength Increase PPI back to 40 mg to assist       Relevant Medications   omeprazole (PRILOSEC) 40 MG capsule     Other   Fever - Primary    Unknown cause; home COVID negative, no sick contacts POCT flu in office positive, Flu A      Relevant Orders   POCT Influenza A/B (Completed)   Return if symptoms worsen or fail to improve.     Vonna Kotyk, FNP, have reviewed all documentation for this visit. The documentation on 11/24/22 for the exam, diagnosis, procedures, and orders are all accurate and complete.  Gwyneth Sprout, [place] [phone] (phone) [phone] (fax)  [place]

## 2022-11-24 NOTE — Assessment & Plan Note (Signed)
Chronic, worsening since decrease in strength Increase PPI back to 40 mg to assist

## 2022-11-28 ENCOUNTER — Ambulatory Visit: Payer: Self-pay

## 2022-11-28 NOTE — Telephone Encounter (Signed)
Message from [employer] sent at 11/28/2022  9:16 AM EST  Summary: cough / rx req   The patient shares that they are continuing to experience discomfort from a, previously seen for, cough  The patient would like to be prescribed additional medication for their cough  Please contact the patient further when possible         Chief Complaint: persistent cough-x 2 weeks Symptoms: coughing up brown to clear phlegm, cough "all the time" Frequency: 2 weeks - was seen in office 11/24/22 Pertinent Negatives: Patient denies fever, SOB Disposition: [] ED /[] Urgent Care (no appt availability in office) / [] Appointment(In office/virtual)/ []  [facility]/ [] Home Care/ [] Refused Recommended Disposition /[] [facility]/ [x]  Follow-up with PCP Additional Notes: routing note to office with pt update in sx and rx request.   Reason for Disposition  Coughing up rusty-colored (reddish-brown) sputum  Answer Assessment - Initial Assessment Questions 1. ONSET: "When did the cough begin?"      2 weeks 2. SEVERITY: "How bad is the cough today?"      Cough "all the time"  3. SPUTUM: "Describe the color of your sputum" (none, dry cough; clear, white, yellow, green)     Brown to clear 4. HEMOPTYSIS: "Are you coughing up any blood?" If so ask: "How much?" (flecks, streaks, tablespoons, etc.)     no 5. DIFFICULTY BREATHING: "Are you having difficulty breathing?" If Yes, ask: "How bad is it?" (e.g., mild, moderate, severe)    - MILD: No SOB at rest, mild SOB with walking, speaks normally in sentences, can lie down, no retractions, pulse < 100.    - MODERATE: SOB at rest, SOB with minimal exertion and prefers to sit, cannot lie down flat, speaks in phrases, mild retractions, audible wheezing, pulse 100-120.    - SEVERE: Very SOB at rest, speaks in single words, struggling to breathe, sitting hunched forward, retractions, pulse > 120      no 6. FEVER: "Do you have a fever?" If Yes, ask: "What  is your temperature, how was it measured, and when did it start?"     no 7. CARDIAC HISTORY: "Do you have any history of heart disease?" (e.g., heart attack, congestive heart failure)      N/A 8. LUNG HISTORY: "Do you have any history of lung disease?"  (e.g., pulmonary embolus, asthma, emphysema)     N/a 9. PE RISK FACTORS: "Do you have a history of blood clots?" (or: recent major surgery, recent prolonged travel, bedridden)     N/a 10. OTHER SYMPTOMS: "Do you have any other symptoms?" (e.g., runny nose, wheezing, chest pain)       Med for cough 11. PREGNANCY: "Is there any chance you are pregnant?" "When was your last menstrual period?"       No  12. TRAVEL: "Have you traveled out of the country in the last month?" (e.g., travel history, exposures)       N/a  Protocols used: Cough - Acute Productive-A-AH

## 2022-11-29 ENCOUNTER — Ambulatory Visit: Payer: Medicare Other | Admitting: Gastroenterology

## 2022-11-29 ENCOUNTER — Other Ambulatory Visit: Payer: Self-pay | Admitting: Family Medicine

## 2022-11-29 MED ORDER — GUAIFENESIN-DM 100-10 MG/5ML PO SYRP: 5.0000 mL | ORAL_SOLUTION | ORAL | 0 refills | Status: DC | PRN

## 2022-11-29 NOTE — Telephone Encounter (Signed)
Patient advised. Verbalized understanding 

## 2022-11-30 ENCOUNTER — Ambulatory Visit: Payer: Medicare Other | Admitting: Gastroenterology

## 2022-11-30 NOTE — Progress Notes (Deleted)
Jonathon Bellows MD, MRCP(U.K) [address]  [place], [place] [address]  Main: [phone]  Fax: [phone]   Gastroenterology Consultation  Referring Provider:     Birdie Sons, MD Primary Care Physician:  Birdie Sons, MD Primary Gastroenterologist:  Dr. Jonathon Bellows  Reason for Consultation:     Abdominal pain         HPI:   Jessica Hall is a 22 y.o. y/o female referred for consultation & management  by Dr. Birdie Sons, MD.      05/2022: H pylori breath test negative, TSH, normal.   Past Medical History:  Diagnosis Date   Acid reflux    ADHD (attention deficit hyperactivity disorder)    Heartburn    History of chicken pox    History of stomach ulcers    Seizure disorder (HCC)     No past surgical history on file.  Prior to Admission medications   Medication Sig Start Date End Date Taking? Authorizing Provider  guaiFENesin-dextromethorphan (ROBITUSSIN DM) 100-10 MG/5ML syrup Take 5 mLs by mouth every 4 (four) hours as needed for cough. 11/29/22   Gwyneth Sprout, FNP  amphetamine-dextroamphetamine (ADDERALL XR) 10 MG 24 hr capsule Take 1 capsule (10 mg total) by mouth daily. 10/10/22   Birdie Sons, MD  clonazePAM (KLONOPIN) 0.5 MG tablet Take 1 tablet (0.5 mg total) by mouth daily. 09/09/22   Birdie Sons, MD  Docusate Calcium (STOOL SOFTENER PO) Take by mouth.    [provider]  omeprazole (PRILOSEC) 40 MG capsule Take 1 capsule (40 mg total) by mouth daily. 11/24/22   Gwyneth Sprout, FNP  oseltamivir (TAMIFLU) 75 MG capsule Take 1 capsule (75 mg total) by mouth 2 (two) times daily. 11/24/22   Gwyneth Sprout, FNP    Family History  Problem Relation Age of Onset   Depression Mother    Suicidality Brother    Depression Father    Bipolar disorder Father    Alcohol abuse Father      Social History   Tobacco Use   Smoking status: Never   Smokeless tobacco: Never  Substance Use Topics   Alcohol use: No    Alcohol/week:  0.0 standard drinks of alcohol   Drug use: No    Allergies as of 11/30/2022   (No Known Allergies)    Review of Systems:    All systems reviewed and negative except where noted in HPI.   Physical Exam:  There were no vitals taken for this visit. No LMP recorded. Psych:  Alert and cooperative. Normal mood and affect. General:   Alert,  Well-developed, well-nourished, pleasant and cooperative in NAD Head:  Normocephalic and atraumatic. Eyes:  Sclera clear, no icterus.   Conjunctiva pink. Ears:  Normal auditory acuity. Neck:  Supple; no masses or thyromegaly. Lungs:  Respirations even and unlabored.  Clear throughout to auscultation.   No wheezes, crackles, or rhonchi. No acute distress. Heart:  Regular rate and rhythm; no murmurs, clicks, rubs, or gallops. Abdomen:  Normal bowel sounds.  No bruits.  Soft, non-tender and non-distended without masses, hepatosplenomegaly or hernias noted.  No guarding or rebound tenderness.    Neurologic:  Alert and oriented x3;  grossly normal neurologically. Psych:  Alert and cooperative. Normal mood and affect.  Imaging Studies: No results found.  Assessment and Plan:   Jessica Hall is a 22 y.o. y/o female has been referred for ***  Follow up in ***  Dr Jonathon Bellows MD,MRCP(U.K)

## 2022-12-19 ENCOUNTER — Ambulatory Visit: Payer: Medicare Other | Admitting: Family Medicine

## 2023-02-20 ENCOUNTER — Ambulatory Visit (INDEPENDENT_AMBULATORY_CARE_PROVIDER_SITE_OTHER): Payer: Medicare Other | Admitting: Family Medicine

## 2023-02-20 VITALS — BP 117/80 | HR 75 | Temp 98.8°F | Wt 201.0 lb

## 2023-02-20 DIAGNOSIS — F900 Attention-deficit hyperactivity disorder, predominantly inattentive type: Secondary | ICD-10-CM

## 2023-02-20 DIAGNOSIS — R1013 Epigastric pain: Secondary | ICD-10-CM

## 2023-02-20 NOTE — Progress Notes (Signed)
    Argentina Ponder DeSanto,acting as a scribe for Lelon Huh, MD.,have documented all relevant documentation on the behalf of Lelon Huh, MD,as directed by  Lelon Huh, MD while in the presence of Lelon Huh, MD.    Established patient visit   Patient: Jessica Hall   DOB: Oct 02, 2001   21 y.o. Female  MRN: [id number] Visit Date: 02/20/2023  Today's healthcare provider: Lelon Huh, MD    Subjective    HPI   ADHD, Follow-Up Patient was last seen for this 4 months ago.  Changes made at that time include continuing Adderall XR She reports good compliance with treatment.   She is not having any side effects. She is here with her mother today and they both report she is doing much better. 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   She also reports she has been having epigastric pain after eating. Feels like a tightness in her upper stomach. Gets a little nauseated. No burning in chest. Is on 40mg  omeprazole for GERD, but not tried any OTC antacids.    Medications: Outpatient Medications Prior to Visit  Medication Sig   amphetamine-dextroamphetamine (ADDERALL XR) 10 MG 24 hr capsule Take 1 capsule (10 mg total) by mouth daily.   clonazePAM (KLONOPIN) 0.5 MG tablet Take 1 tablet (0.5 mg total) by mouth daily.   Docusate Calcium (STOOL SOFTENER PO) Take by mouth.   omeprazole (PRILOSEC) 40 MG capsule Take 1 capsule (40 mg total) by mouth daily.   [DISCONTINUED] guaiFENesin-dextromethorphan (ROBITUSSIN DM) 100-10 MG/5ML syrup Take 5 mLs by mouth every 4 (four) hours as needed for cough.   [DISCONTINUED] oseltamivir (TAMIFLU) 75 MG capsule Take 1 capsule (75 mg total) by mouth 2 (two) times daily.   No facility-administered medications prior to visit.    Review of Systems  Gastrointestinal:  Positive for abdominal pain and nausea. Negative for blood in stool, constipation, diarrhea and vomiting.       Objective    BP 117/80 (BP Location: Left Arm,  Patient Position: Sitting, Cuff Size: Normal)   Pulse 75   Temp 98.8 F (37.1 C) (Oral)   Wt 201 lb (91.2 kg)   SpO2 99%   BMI 34.50 kg/m    Physical Exam  General Appearance:    Obese female, alert, cooperative, in no acute distress  Eyes:    PERRL, conjunctiva/corneas clear, EOM's intact       Abdomen:   bowel sounds present and normal in all 4 quadrants, soft, round, nontender, or nondistended. No CVA tenderness        Assessment & Plan     1. Epigastric pain Already on omeprazole 40mg  a day  - CBC - Comprehensive metabolic panel - H. pylori breath test - Lipase  2. Attention deficit hyperactivity disorder (ADHD), predominantly inattentive type Doing very well with Adderall. Continue current medications.        The entirety of the information documented in the History of Present Illness, Review of Systems and Physical Exam were personally obtained by me. Portions of this information were initially documented by the CMA and reviewed by me for thoroughness and accuracy.     Lelon Huh, MD  [place] [phone] (phone) [phone] (fax)  [place]

## 2023-02-21 ENCOUNTER — Other Ambulatory Visit: Payer: Self-pay | Admitting: Family Medicine

## 2023-02-21 LAB — CBC
Hematocrit: 39.4 % (ref 34.0–46.6)
Hemoglobin: 12.4 g/dL (ref 11.1–15.9)
MCH: 23.8 pg — ABNORMAL LOW (ref 26.6–33.0)
MCHC: 31.5 g/dL (ref 31.5–35.7)
MCV: 76 fL — ABNORMAL LOW (ref 79–97)
Platelets: 403 10*3/uL (ref 150–450)
RBC: 5.21 x10E6/uL (ref 3.77–5.28)
RDW: 14.5 % (ref 11.7–15.4)
WBC: 10.4 10*3/uL (ref 3.4–10.8)

## 2023-02-21 LAB — COMPREHENSIVE METABOLIC PANEL
ALT: 25 IU/L (ref 0–32)
AST: 22 IU/L (ref 0–40)
Albumin/Globulin Ratio: 1.6 (ref 1.2–2.2)
Albumin: 4.3 g/dL (ref 4.0–5.0)
Alkaline Phosphatase: 137 IU/L — ABNORMAL HIGH (ref 44–121)
BUN/Creatinine Ratio: 15 (ref 9–23)
BUN: 13 mg/dL (ref 6–20)
Bilirubin Total: 0.2 mg/dL (ref 0.0–1.2)
CO2: 23 mmol/L (ref 20–29)
Calcium: 9.8 mg/dL (ref 8.7–10.2)
Chloride: 102 mmol/L (ref 96–106)
Creatinine, Ser: 0.89 mg/dL (ref 0.57–1.00)
Globulin, Total: 2.7 g/dL (ref 1.5–4.5)
Glucose: 85 mg/dL (ref 70–99)
Potassium: 4.4 mmol/L (ref 3.5–5.2)
Sodium: 137 mmol/L (ref 134–144)
Total Protein: 7 g/dL (ref 6.0–8.5)
eGFR: 93 mL/min/{1.73_m2} (ref >59)

## 2023-02-21 LAB — LIPASE: Lipase: 32 U/L (ref 14–72)

## 2023-02-23 LAB — H. PYLORI BREATH TEST: H pylori Breath Test: NEGATIVE

## 2023-03-24 ENCOUNTER — Ambulatory Visit: Payer: Self-pay | Admitting: *Deleted
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
## 2023-03-27 ENCOUNTER — Encounter: Payer: Self-pay | Admitting: Family Medicine

## 2023-03-27 ENCOUNTER — Ambulatory Visit (INDEPENDENT_AMBULATORY_CARE_PROVIDER_SITE_OTHER): Payer: Medicare Other | Admitting: Family Medicine

## 2023-03-27 VITALS — BP 113/74 | HR 69 | Temp 98.6°F | Resp 14 | Ht 64.0 in | Wt 206.0 lb

## 2023-03-27 DIAGNOSIS — S46811A Strain of other muscles, fascia and tendons at shoulder and upper arm level, right arm, initial encounter: Secondary | ICD-10-CM

## 2023-03-27 DIAGNOSIS — Z975 Presence of (intrauterine) contraceptive device: Secondary | ICD-10-CM | POA: Diagnosis not present

## 2023-03-27 NOTE — Progress Notes (Deleted)
     Established patient visit   Patient: Jessica Hall   DOB: Feb 13, 2001   22 y.o. Female  MRN: [id number] Visit Date: 03/27/2023  Today's healthcare provider: Sherlyn Hay, DO   No chief complaint on file.  Subjective    HPI  Patient is present to check in on nexplanon that was inserted in her right arm on 08/03/22 Contraception Counseling: Patient presents for contraception counseling. The patient has no complaints today. The patient {sys sexually active:13135} sexually active. Pertinent past medical history: {contraception pert [id number].  Medications: Outpatient Medications Prior to Visit  Medication Sig   amphetamine-dextroamphetamine (ADDERALL XR) 10 MG 24 hr capsule Take 1 capsule (10 mg total) by mouth daily.   clonazePAM (KLONOPIN) 0.5 MG tablet Take 1 tablet (0.5 mg total) by mouth daily.   Docusate Calcium (STOOL SOFTENER PO) Take by mouth.   omeprazole (PRILOSEC) 40 MG capsule Take 1 capsule (40 mg total) by mouth daily.   No facility-administered medications prior to visit.    Review of Systems  {Labs  Heme  Chem  Endocrine  Serology  Results Review (optional):23779}   Objective    There were no vitals taken for this visit. {Show previous vital signs (optional):23777}  Physical Exam  ***  No results found for any visits on 03/27/23.  Assessment & Plan     ***  No follow-ups on file.      {provider attestation***:1}   Sherlyn Hay, DO  [facility] [phone] (phone) [phone] (fax)  [facility]

## 2023-03-27 NOTE — Progress Notes (Deleted)
    I,[name],acting as a Neurosurgeon for [employer], DO.,have documented all relevant documentation on the behalf of [employer], DO,as directed by  [employer], DO while in the presence of Sherlyn Hay, DO.    Established patient visit   Patient: Jessica Hall   DOB: 2001-09-09   22 y.o. Female  MRN: [id number] Visit Date: 03/27/2023  Today's healthcare provider: Sherlyn Hay, DO   Chief Complaint  Patient presents with   Arm Pain    Soreness in R arm where birth control is placed sin last thursday   Contraception    Would like to make sure it is still placed correctly   Subjective    HPI HPI     Arm Pain    Additional comments: Soreness in R arm where birth control is placed sin last thursday        Contraception    Additional comments: Would like to make sure it is still placed correctly      Last edited by Vital, Erie Noe, CMA on 03/27/2023  1:16 PM.      ***  Medications: Outpatient Medications Prior to Visit  Medication Sig   amphetamine-dextroamphetamine (ADDERALL XR) 10 MG 24 hr capsule Take 1 capsule (10 mg total) by mouth daily.   clonazePAM (KLONOPIN) 0.5 MG tablet Take 1 tablet (0.5 mg total) by mouth daily.   Docusate Calcium (STOOL SOFTENER PO) Take by mouth.   omeprazole (PRILOSEC) 40 MG capsule Take 1 capsule (40 mg total) by mouth daily.   No facility-administered medications prior to visit.    Review of Systems  {Labs  Heme  Chem  Endocrine  Serology  Results Review (optional):23779}   Objective    BP 113/74 (BP Location: Left Arm, Patient Position: Sitting, Cuff Size: Normal)   Pulse 69   Temp 98.6 F (37 C) (Oral)   Resp 14   Ht 5\' 4"  (1.626 m)   Wt 206 lb (93.4 kg)   LMP 03/10/2023 Comment: lasted about 16 days  SpO2 100%   BMI 35.36 kg/m  {Show previous vital signs (optional):23777}  Physical Exam  ***  No results found for any visits on 03/27/23.  Assessment & Plan     ***  No follow-ups on file.       {provider attestation***:1}   Sherlyn Hay, DO  [facility] [phone] (phone) [phone] (fax)  [facility]

## 2023-03-27 NOTE — Patient Instructions (Addendum)
You can do the exercises on the included sheet; you can also do other exercises you find that specifically target the supraspinatous muscle.  Naproxen (Aleve) - take two 220 mg tablets twice a day for 5-7 days. Do NOT take ibuprofen (Advil) or other NSAID medications while taking this medication.  As you are beyond 48 hours from your injury, you [place] use ice or heat applied to the area externally as tolerated/preferred.

## 2023-03-27 NOTE — Progress Notes (Signed)
I,Jessica  Hall,acting as a Neurosurgeon for [employer], DO.,have documented all relevant documentation on the behalf of [employer], DO,as directed by  [employer], DO while in the presence of Jessica Hathaway N Azelie Noguera, DO.   Acute Office Visit  Subjective:     Patient ID: Jessica Hall, female    DOB: [birth year], 22 y.o.   MRN: [id number]  Chief Complaint  Patient presents with   Arm Pain    Soreness in R arm where birth control is placed sin last thursday   Contraception    Would like to make sure it is still placed correctly    Arm Pain  Pertinent negatives include no chest pain.   Patient is in today for evaluation of her right shoulder and her Nexplanon. She notes that her right shoulder is hurting because a friend was playing around and grabbed her superior right shoulder roughly.  She has been having continued discomfort in that region since this occurred four days ago, despite therapy with two tablets of OTC ibuprofen approximately every four hours.  She also notes that her right upper arm was hurting during the time, though this has since resolved on its own.  LMP 03/10/23 - first period since insertion of Nexplanon. She had light flow for about a week, then spotting for a week, then it stopped. She had some cramping pelvic pain during her period.  Review of Systems  Respiratory:  Negative for shortness of breath.   Cardiovascular:  Negative for chest pain and palpitations.  Gastrointestinal:  Negative for nausea and vomiting.  Musculoskeletal:  Positive for joint pain.  Skin:  Negative for itching and rash.  Neurological:  Negative for dizziness, weakness and headaches.       Objective:    BP 113/74 (BP Location: Left Arm, Patient Position: Sitting, Cuff Size: Normal)   Pulse 69   Temp 98.6 F (37 C) (Oral)   Resp 14   Ht 5\' 4"  (1.626 m)   Wt 206 lb (93.4 kg)   LMP 03/10/2023 Comment: lasted about 16 days  SpO2 100%   BMI 35.36 kg/m    Physical Exam Vitals  reviewed.  Constitutional:      General: She is not in acute distress.    Appearance: She is well-developed.  HENT:     Head: Normocephalic and atraumatic.  Eyes:     General: No scleral icterus.    Conjunctiva/sclera: Conjunctivae normal.  Cardiovascular:     Rate and Rhythm: Normal rate and regular rhythm.  Pulmonary:     Effort: Pulmonary effort is normal. No respiratory distress.  Musculoskeletal:     Right shoulder: No swelling, deformity, effusion, laceration, tenderness, bony tenderness or crepitus. Decreased range of motion. Normal strength. Normal pulse.     Left shoulder: Normal.       Arms:     Comments: Decreased abduction with pain at end range of motion located in the region of the supraspinatous and superolateral trapezius (as noted on posterior-facing image above); no tenderness to palpation.  Positive for pain with empty can test on the right side.  Skin:    General: Skin is warm and dry.     Findings: No rash.     Comments: Nexplanon easily palpable just below the skin of the medial upper right arm and feels to be intact (it is straight and smooth with no obvious separation anywhere along the length).  (Nexplanon noted on anterior facing image above)  Neurological:  Mental Status: She is alert and oriented to person, place, and time.  Psychiatric:        Behavior: Behavior normal.        Assessment & Plan:   1. Supraspinatus sprain, right, initial encounter Discussed with patient doing gentle exercises to strengthen the muscles in the area, as well as taking OTC naproxen 220 mg two tabs twice daily for 5-7 days to reduce the inflammation in the area more consistently. Counseled patient on the importance of avoiding other NSAIDs (including but not limited to ibuprofen) while taking the naproxen; explained the potential for damage to her kidneys if she is not careful to do so. Advised that she [place]ier use ice or heat as tolerated since we are greater than 48 hours out  from the incident that caused her injury. Patient expressed understanding of these instructions. Included this information on her AVS.  2. Trapezius muscle strain, right, initial encounter As above.  3. Nexplanon in place Nexplanon was examined and was determined to most likely be fully intact.  Patient's pain in the region is reported to have occurred briefly and have resolved on its own. The aggravating injury additionally occurred at the superior shoulder, rather than around the upper arm.  Additionally, patient has not experienced any change in her menstrual cycle since her injury this past Thursday (03/24/23)    Return in about 4 weeks (around 04/24/2023), or if symptoms worsen or fail to improve.     The entirety of the information documented in the History of Present Illness, Review of Systems and Physical Exam were personally obtained by me. Portions of this information were initially documented by the [facility] Erie Noe Hall and reviewed by me for thoroughness and accuracy.     Jessica Glinski N Xylan Sheils, DO

## 2023-03-28 ENCOUNTER — Ambulatory Visit: Payer: Medicare Other | Admitting: Family Medicine

## 2023-04-24 ENCOUNTER — Ambulatory Visit: Payer: Medicare Other | Admitting: Family Medicine

## 2023-05-16 ENCOUNTER — Encounter: Payer: Self-pay | Admitting: Physician Assistant

## 2023-05-16 ENCOUNTER — Ambulatory Visit (INDEPENDENT_AMBULATORY_CARE_PROVIDER_SITE_OTHER): Payer: Medicare Other | Admitting: Physician Assistant

## 2023-05-16 VITALS — BP 121/80 | HR 80 | Temp 98.4°F | Wt 204.0 lb

## 2023-05-16 DIAGNOSIS — R1013 Epigastric pain: Secondary | ICD-10-CM | POA: Diagnosis not present

## 2023-05-16 DIAGNOSIS — F419 Anxiety disorder, unspecified: Secondary | ICD-10-CM | POA: Diagnosis not present

## 2023-05-16 DIAGNOSIS — F32A Depression, unspecified: Secondary | ICD-10-CM

## 2023-05-16 MED ORDER — FAMOTIDINE 20 MG PO TABS
20.0000 mg | ORAL_TABLET | Freq: Every day | ORAL | 0 refills | Status: DC
Start: 2023-05-16 — End: 2023-06-08

## 2023-05-16 NOTE — Progress Notes (Signed)
Established patient visit  Patient: Jessica Hall   DOB: 05-Oct-2001   22 y.o. Female  MRN: [id number] Visit Date: 05/16/2023  Today's healthcare provider: Debera Lat, PA-C   Chief Complaint  Patient presents with   Abdominal Pain    Patient was put on Omeprazole but states she is still in pain.    Subjective      Discussed the use of AI scribe software for clinical note transcription with the patient, who gave verbal consent to proceed.  History of Present Illness   The patient, with a history of anxiety, depression, and stomach problems, presents with ongoing abdominal discomfort. They report that their current medication, omeprazole, is not providing relief. The patient describes the pain as cramp-like, located in the upper abdomen, and often occurring after eating. They deny experiencing a sour taste in their mouth, bloating, or belching, but note that their stomach feels tight after meals. The patient also reports that bowel movements often alleviate the pain. They deny any changes in bowel habits, such as diarrhea or constipation. The patient's discomfort does not worsen at night or when lying down. They have noticed that certain foods, particularly greasy ones, exacerbate their symptoms. The patient also mentions a history of consuming soda and spicy foods, which they have tried to reduce.           03/27/2023    1:23 PM 02/20/2023    3:22 PM 10/25/2022    1:46 PM  Depression screen PHQ 2/9  Decreased Interest 0 2 0  Down, Depressed, Hopeless 0 2 1  PHQ - 2 Score 0 4 1  Altered sleeping 0 3 1  Tired, decreased energy 0 3 1  Change in appetite 1 1 0  Feeling bad or failure about yourself  0 2 0  Trouble concentrating 0 2 1  Moving slowly or fidgety/restless 0 0 0  Suicidal thoughts 0 0 0  PHQ-9 Score 1 15 4   Difficult doing work/chores Not difficult at all Extremely dIfficult Not difficult at all      10/10/2022    3:08 PM  GAD 7 : Generalized Anxiety Score  Nervous,  Anxious, on Edge 2  Control/stop worrying 0  Worry too much - different things 0  Trouble relaxing 0  Restless 0  Easily annoyed or irritable 0  Afraid - awful might happen 0  Total GAD 7 Score 2  Anxiety Difficulty Not difficult at all    Medications: Outpatient Medications Prior to Visit  Medication Sig   amphetamine-dextroamphetamine (ADDERALL XR) 10 MG 24 hr capsule Take 1 capsule (10 mg total) by mouth daily.   clonazePAM (KLONOPIN) 0.5 MG tablet Take 1 tablet (0.5 mg total) by mouth daily.   Docusate Calcium (STOOL SOFTENER PO) Take by mouth.   omeprazole (PRILOSEC) 40 MG capsule Take 1 capsule (40 mg total) by mouth daily. (Patient not taking: Reported on 05/16/2023)   No facility-administered medications prior to visit.    Review of Systems  All other systems reviewed and are negative.  Except see HPI      Objective    BP 121/80 (BP Location: Left Arm, Patient Position: Sitting, Cuff Size: Large)   Pulse 80   Temp 98.4 F (36.9 C) (Oral)   Wt 204 lb (92.5 kg)   SpO2 100%   BMI 35.02 kg/m    Physical Exam Vitals reviewed.  Constitutional:      General: She is not in acute distress.    Appearance: Normal appearance. She  is well-developed. She is obese. She is not diaphoretic.  HENT:     Head: Normocephalic and atraumatic.  Eyes:     General: No scleral icterus.    Conjunctiva/sclera: Conjunctivae normal.  Neck:     Thyroid: No thyromegaly.  Cardiovascular:     Rate and Rhythm: Normal rate and regular rhythm.     Pulses: Normal pulses.     Heart sounds: Normal heart sounds. No murmur heard. Pulmonary:     Effort: Pulmonary effort is normal. No respiratory distress.     Breath sounds: Normal breath sounds. No wheezing, rhonchi or rales.  Abdominal:     General: Bowel sounds are normal. There is distension.     Palpations: There is no shifting dullness, fluid wave, hepatomegaly, splenomegaly, mass or pulsatile mass.     Tenderness: There is abdominal  tenderness in the epigastric area. There is no right CVA tenderness, left CVA tenderness, guarding or rebound. Negative signs include Murphy's sign, Rovsing's sign, McBurney's sign, psoas sign and obturator sign.  Musculoskeletal:     Cervical back: Neck supple.     Right lower leg: No edema.     Left lower leg: No edema.  Lymphadenopathy:     Cervical: No cervical adenopathy.  Skin:    General: Skin is warm and dry.     Findings: No rash.  Neurological:     Mental Status: She is alert and oriented to person, place, and time. Mental status is at baseline.  Psychiatric:        Mood and Affect: Mood normal.        Behavior: Behavior normal.      No results found for any visits on 05/16/23.  Assessment & Plan        Gastroesophageal Reflux Disease (GERD): Persistent epigastric pain despite Omeprazole 40mg . Could be due to consumption of trigger food. Pain is associated with meals and improves with bowel movements. LABS H. pylori: negative (2023) No dysphagia. -Start Pepcid 20mg  in the morning before meals. -Continue Omeprazole 40mg  in the evening before meals. -Avoid trigger foods and overeating. -Consider further investigation with ultrasound if symptoms persist.- -Transition to healthy meals and daily exercise/ weight loss Elevate the head of the bed 6-8 inches, avoid recumbency for 3 hours after eating,advised.    Anxiety and Depression: Chronic and stable. Long-standing history, currently managed with Klonopin. -Continue Klonopin as prescribed.     Return in about 2 weeks (around 05/30/2023) for Gerd FU.     The patient was advised to call back or seek an in-person evaluation if the symptoms worsen or if the condition fails to improve as anticipated.  I discussed the assessment and treatment plan with the patient. The patient was provided an opportunity to ask questions and all were answered. The patient agreed with the plan and demonstrated an understanding of the  instructions.  I, Debera Lat, PA-C have reviewed all documentation for this visit. The documentation on  05/16/23 for the exam, diagnosis, procedures, and orders are all accurate and complete.  Debera Lat, [facility], MMS [facility] [phone] (phone) [phone] (fax)   [facility]

## 2023-05-16 NOTE — Progress Notes (Deleted)
      Established patient visit   Patient: Jessica Hall   DOB: May 15, 2001   22 y.o. Female  MRN: [id number] Visit Date: 05/16/2023  Today's healthcare provider: Debera Lat, PA-C   No chief complaint on file.  Subjective    HPI  Abdominal Pain  Patient is a 22 year old female who presents for evaluation of abdominal discomfort.   Associated symptoms: {Yes/No:20286} anorexia  {Yes/No:20286} belching  {Yes/No:20286} bloody stool {Yes/No:20286} blood in urine   {Yes/No:20286} constipation {Yes/No:20286} diarrhea  {Yes/No:20286} dysuria {Yes/No:20286} fever  {Yes/No:20286} flatus {Yes/No:20286} headaches  {Yes/No:20286} headaches {Yes/No:20286} joint pains  {Yes/No:20286} myalgias {Yes/No:20286} nausea  {Yes/No:20286} vomiting {Yes/No:20286} weight loss     Recent GI studies:{GI studies:119307} {Relevant medical history includes:119311:::1}  Previous labs Lab Results  Component Value Date   WBC 10.4 02/20/2023   HGB 12.4 02/20/2023   HCT 39.4 02/20/2023   MCV 76 (L) 02/20/2023   MCH 23.8 (L) 02/20/2023   RDW 14.5 02/20/2023   PLT 403 02/20/2023   Lab Results  Component Value Date   GLUCOSE 85 02/20/2023   NA 137 02/20/2023   K 4.4 02/20/2023   CL 102 02/20/2023   CO2 23 02/20/2023   BUN 13 02/20/2023   CREATININE 0.89 02/20/2023   GFRNONAA CANCELED 01/06/2016   GFRAA CANCELED 01/06/2016   CALCIUM 9.8 02/20/2023   PROT 7.0 02/20/2023   ALBUMIN 4.3 02/20/2023   LABGLOB 2.7 02/20/2023   AGRATIO 1.6 02/20/2023   BILITOT 0.2 02/20/2023   ALKPHOS 137 (H) 02/20/2023   AST 22 02/20/2023   ALT 25 02/20/2023   Lab Results  Component Value Date   AMYLASE 57 01/06/2016   -----------------------------------------------------------------------------------------   Medications: Outpatient Medications Prior to Visit  Medication Sig   amphetamine-dextroamphetamine (ADDERALL XR) 10 MG 24 hr capsule Take 1 capsule (10 mg total) by mouth daily.   clonazePAM  (KLONOPIN) 0.5 MG tablet Take 1 tablet (0.5 mg total) by mouth daily.   Docusate Calcium (STOOL SOFTENER PO) Take by mouth.   omeprazole (PRILOSEC) 40 MG capsule Take 1 capsule (40 mg total) by mouth daily.   No facility-administered medications prior to visit.    Review of Systems  {Labs  Heme  Chem  Endocrine  Serology  Results Review (optional):23779}   Objective    There were no vitals taken for this visit. {Show previous vital signs (optional):23777}  Physical Exam  ***  No results found for any visits on 05/16/23.  Assessment & Plan     ***  No follow-ups on file.      {provider attestation***:1}   Debera Lat, PA-C  [facility] [phone] (phone) [phone] (fax)  [facility]

## 2023-05-30 ENCOUNTER — Ambulatory Visit: Payer: Medicare Other | Admitting: Physician Assistant

## 2023-05-30 NOTE — Progress Notes (Deleted)
  Established patient visit  Patient: Jessica Hall   DOB: 09/07/2001   22 y.o. Female  MRN: [id number] Visit Date: 05/30/2023  Today's healthcare provider: Debera Lat, PA-C   No chief complaint on file.  Subjective    HPI  *** Discussed the use of AI scribe software for clinical note transcription with the patient, who gave verbal consent to proceed.  History of Present Illness               03/27/2023    1:23 PM 02/20/2023    3:22 PM 10/25/2022    1:46 PM  Depression screen PHQ 2/9  Decreased Interest 0 2 0  Down, Depressed, Hopeless 0 2 1  PHQ - 2 Score 0 4 1  Altered sleeping 0 3 1  Tired, decreased energy 0 3 1  Change in appetite 1 1 0  Feeling bad or failure about yourself  0 2 0  Trouble concentrating 0 2 1  Moving slowly or fidgety/restless 0 0 0  Suicidal thoughts 0 0 0  PHQ-9 Score 1 15 4   Difficult doing work/chores Not difficult at all Extremely dIfficult Not difficult at all      10/10/2022    3:08 PM  GAD 7 : Generalized Anxiety Score  Nervous, Anxious, on Edge 2  Control/stop worrying 0  Worry too much - different things 0  Trouble relaxing 0  Restless 0  Easily annoyed or irritable 0  Afraid - awful might happen 0  Total GAD 7 Score 2  Anxiety Difficulty Not difficult at all    Medications: Outpatient Medications Prior to Visit  Medication Sig   amphetamine-dextroamphetamine (ADDERALL XR) 10 MG 24 hr capsule Take 1 capsule (10 mg total) by mouth daily.   clonazePAM (KLONOPIN) 0.5 MG tablet Take 1 tablet (0.5 mg total) by mouth daily.   Docusate Calcium (STOOL SOFTENER PO) Take by mouth.   famotidine (PEPCID) 20 MG tablet Take 1 tablet (20 mg total) by mouth daily.   omeprazole (PRILOSEC) 40 MG capsule Take 1 capsule (40 mg total) by mouth daily. (Patient not taking: Reported on 05/16/2023)   No facility-administered medications prior to visit.    Review of Systems Except see HPI   {Labs  Heme  Chem  Endocrine  Serology  Results  Review (optional):23779}   Objective    There were no vitals taken for this visit. {Show previous vital signs (optional):23777}  Physical Exam   No results found for any visits on 05/30/23.  Assessment & Plan    *** Assessment and Plan              No follow-ups on file.      [facility]

## 2023-06-08 ENCOUNTER — Other Ambulatory Visit: Payer: Self-pay | Admitting: Physician Assistant

## 2023-06-08 DIAGNOSIS — R1013 Epigastric pain: Secondary | ICD-10-CM

## 2023-07-19 ENCOUNTER — Ambulatory Visit (INDEPENDENT_AMBULATORY_CARE_PROVIDER_SITE_OTHER): Payer: Medicare Other | Admitting: Physician Assistant

## 2023-07-19 ENCOUNTER — Encounter: Payer: Self-pay | Admitting: Physician Assistant

## 2023-07-19 VITALS — BP 122/87 | HR 81 | Temp 98.2°F | Ht 64.0 in | Wt 203.9 lb

## 2023-07-19 DIAGNOSIS — M25511 Pain in right shoulder: Secondary | ICD-10-CM

## 2023-07-19 DIAGNOSIS — G8929 Other chronic pain: Secondary | ICD-10-CM | POA: Diagnosis not present

## 2023-07-19 MED ORDER — CELECOXIB 100 MG PO CAPS: 100.0000 mg | ORAL_CAPSULE | Freq: Two times a day (BID) | ORAL | 0 refills | Status: DC

## 2023-07-19 NOTE — Progress Notes (Unsigned)
Established patient visit  Patient: Jessica Hall   DOB: 09-20-2001   22 y.o. Female  MRN: [id number] Visit Date: 07/19/2023  Today's healthcare provider: Debera Lat, PA-C   Chief Complaint  Patient presents with   Shoulder Pain    Follow-up from last visit right shoulder no change, wants xrays   Subjective    HPI  Presents for right shoulder pain.  Was seen by Dr. Payton Mccallum on 03/27/2023 for soreness in the right arm were birth control was placed.  Patient was diagnosed with supraspinatus/trapezius muscle sprain and was advised to take over-the-counter naproxen 220 mcg 2 twice daily for 7 days, use ice or heat as tolerated Reports improvement in pain and range of motion in right shoulder.  Denies having any swelling redness tingling or numbness sensation in the right arm/shoulder             07/19/2023    2:33 PM 03/27/2023    1:23 PM 02/20/2023    3:22 PM  Depression screen PHQ 2/9  Decreased Interest 0 0 2  Down, Depressed, Hopeless 0 0 2  PHQ - 2 Score 0 0 4  Altered sleeping 0 0 3  Tired, decreased energy 0 0 3  Change in appetite 0 1 1  Feeling bad or failure about yourself  0 0 2  Trouble concentrating 0 0 2  Moving slowly or fidgety/restless 0 0 0  Suicidal thoughts 0 0 0  PHQ-9 Score 0 1 15  Difficult doing work/chores Not difficult at all Not difficult at all Extremely dIfficult      07/19/2023    2:33 PM 10/10/2022    3:08 PM  GAD 7 : Generalized Anxiety Score  Nervous, Anxious, on Edge 0 2  Control/stop worrying 0 0  Worry too much - different things 0 0  Trouble relaxing 0 0  Restless 0 0  Easily annoyed or irritable 0 0  Afraid - awful might happen 0 0  Total GAD 7 Score 0 2  Anxiety Difficulty Not difficult at all Not difficult at all    Medications: Outpatient Medications Prior to Visit  Medication Sig   amphetamine-dextroamphetamine (ADDERALL XR) 10 MG 24 hr capsule Take 1 capsule (10 mg total) by mouth daily.   clonazePAM (KLONOPIN) 0.5 MG  tablet Take 1 tablet (0.5 mg total) by mouth daily.   Docusate Calcium (STOOL SOFTENER PO) Take by mouth.   famotidine (PEPCID) 20 MG tablet TAKE 1 TABLET BY MOUTH EVERY DAY   omeprazole (PRILOSEC) 40 MG capsule Take 1 capsule (40 mg total) by mouth daily.   No facility-administered medications prior to visit.    Review of Systems  All other systems reviewed and are negative.  Except see HPI       Objective    BP 122/87   Pulse 81   Temp 98.2 F (36.8 C)   Ht 5\' 4"  (1.626 m)   Wt 203 lb 14.4 oz (92.5 kg)   LMP 06/07/2023   SpO2 98%   BMI 35.00 kg/m     Physical Exam Constitutional:      General: She is not in acute distress.    Appearance: Normal appearance. She is obese.  HENT:     Head: Normocephalic.  Pulmonary:     Effort: Pulmonary effort is normal. No respiratory distress.  Musculoskeletal:        General: Tenderness (On palpation and with overhead movements) present.  Neurological:     Mental Status: She is alert  and oriented to person, place, and time. Mental status is at baseline.      No results found for any visits on 07/19/23.  Assessment & Plan     1. Chronic right shoulder pain Improving Could be related to his deconditioning Advised regular exercise as tolerated - Ambulatory referral to Physical Therapy - celecoxib (CELEBREX) 100 MG capsule; Take 1 capsule (100 mg total) by mouth 2 (two) times daily.  Dispense: 60 capsule; Refill: 0/with meals Advised to take it with vitamin group B and alternate with Tylenol Continue heat/ice as tolerated  No follow-ups on file.     The patient was advised to call back or seek an in-person evaluation if the symptoms worsen or if the condition fails to improve as anticipated.  I discussed the assessment and treatment plan with the patient. The patient was provided an opportunity to ask questions and all were answered. The patient agreed with the plan and demonstrated an understanding of the  instructions.  I, Debera Lat, PA-C have reviewed all documentation for this visit. The documentation on  07/20/23 for the exam, diagnosis, procedures, and orders are all accurate and complete.  Debera Lat, [facility], MMS [facility] [phone] (phone) [phone] (fax)  [facility]

## 2023-09-14 ENCOUNTER — Ambulatory Visit (INDEPENDENT_AMBULATORY_CARE_PROVIDER_SITE_OTHER): Payer: 59 | Admitting: Physician Assistant

## 2023-09-14 DIAGNOSIS — Z3009 Encounter for other general counseling and advice on contraception: Secondary | ICD-10-CM

## 2023-09-15 NOTE — Progress Notes (Signed)
Patient was not seen for appt d/t no call, no show, or late arrival >10 mins past appt time.    Debera Lat PA [facility] [address] [place], [place] [address] [phone] (phone) [phone] (fax) [facility]

## 2023-10-02 ENCOUNTER — Encounter: Payer: Self-pay | Admitting: Family Medicine

## 2023-10-02 ENCOUNTER — Ambulatory Visit (INDEPENDENT_AMBULATORY_CARE_PROVIDER_SITE_OTHER): Payer: 59 | Admitting: Family Medicine

## 2023-10-02 VITALS — BP 109/76 | HR 72 | Ht 64.0 in | Wt 201.0 lb

## 2023-10-02 DIAGNOSIS — R1084 Generalized abdominal pain: Secondary | ICD-10-CM

## 2023-10-02 MED ORDER — HYOSCYAMINE SULFATE 0.125 MG SL SUBL: 0.1250 mg | SUBLINGUAL_TABLET | SUBLINGUAL | 0 refills | Status: DC | PRN

## 2023-10-02 NOTE — Progress Notes (Unsigned)
      Established patient visit   Patient: Jessica Hall   DOB: Vandevelde 18, 2002   22 y.o. Female  MRN: [id number] Visit Date: 10/02/2023  Today's healthcare provider: Mila Merry, MD   Chief Complaint  Patient presents with   digestive concern   Subjective    Discussed the use of AI scribe software for clinical note transcription with the patient, who gave verbal consent to proceed.  History of Present Illness   The patient, known to have been on birth control for almost a year, presents with a month-long history of early satiety and postprandial abdominal discomfort. She reports that after consuming only a few bites of food, she feels full, yet still experiences hunger. The discomfort is described as a cramping sensation, located across the abdomen, without any radiation. The patient denies any associated fevers, chills, or sweats. She also reports occasional nausea, but no changes in bowel habits. The patient has been taking Adderall on an as-needed basis, with no recent changes in medication. The patient's mother notes that the patient's stomach feels hard after eating, despite the patient still feeling hungry. The patient denies any pain radiating to the back. The patient's mother also mentions a family history of ovarian cysts in the patient's older sister, but the patient's symptoms do not seem to align with this condition.       Medications: Outpatient Medications Prior to Visit  Medication Sig   amphetamine-dextroamphetamine (ADDERALL XR) 10 MG 24 hr capsule Take 1 capsule (10 mg total) by mouth daily.   celecoxib (CELEBREX) 100 MG capsule Take 1 capsule (100 mg total) by mouth 2 (two) times daily.   clonazePAM (KLONOPIN) 0.5 MG tablet Take 1 tablet (0.5 mg total) by mouth daily.   Docusate Calcium (STOOL SOFTENER PO) Take by mouth.   famotidine (PEPCID) 20 MG tablet TAKE 1 TABLET BY MOUTH EVERY DAY   omeprazole (PRILOSEC) 40 MG capsule Take 1 capsule (40 mg total) by mouth daily.    No facility-administered medications prior to visit.   Review of Systems {Insert previous labs (optional):23779} {See past labs  Heme  Chem  Endocrine  Serology  Results Review (optional):1}   Objective    BP 109/76 (BP Location: Left Arm, Patient Position: Sitting, Cuff Size: Normal)   Pulse 72   Ht 5\' 4"  (1.626 m)   Wt 201 lb (91.2 kg)   SpO2 97%   BMI 34.50 kg/m {Insert last BP/Wt (optional):23777}{See vitals history (optional):1}  Physical Exam  Physical Exam   ABDOMEN: No tenderness upon palpation. MUSCULOSKELETAL: No tenderness upon palpation of the back.    No results found for any visits on 10/02/23.  Assessment & Plan        Abdominal Pain New onset of abdominal pain for about a month, associated with early satiety and occasional nausea. No fever, chills, or changes in bowel habits. No tenderness on physical examination. Differential diagnosis includes gallbladder disease, pancreatitis, and gastric ulcers. -Order CBC with differential, metabolic panel, and lipase. -Prescribe hyoscyamine to manage symptoms while awaiting lab results. -Consider abdominal ultrasound pending lab results.  Birth Control On birth control for almost a year, no recent changes. -Continue current regimen.  ADHD On Adderall, taken intermittently. -Continue current regimen.    No follow-ups on file.      Mila Merry, MD  [facility] [phone] (phone) [phone] (fax)  [facility]

## 2023-10-03 LAB — CBC WITH DIFFERENTIAL/PLATELET
Basophils Absolute: 0 10*3/uL (ref 0.0–0.2)
Basos: 1 %
EOS (ABSOLUTE): 0.1 10*3/uL (ref 0.0–0.4)
Eos: 1 %
Hematocrit: 44.2 % (ref 34.0–46.6)
Hemoglobin: 13.4 g/dL (ref 11.1–15.9)
Immature Grans (Abs): 0 10*3/uL (ref 0.0–0.1)
Immature Granulocytes: 0 %
Lymphocytes Absolute: 2.1 10*3/uL (ref 0.7–3.1)
Lymphs: 24 %
MCH: 24.8 pg — ABNORMAL LOW (ref 26.6–33.0)
MCHC: 30.3 g/dL — ABNORMAL LOW (ref 31.5–35.7)
MCV: 82 fL (ref 79–97)
Monocytes Absolute: 0.6 10*3/uL (ref 0.1–0.9)
Monocytes: 6 %
Neutrophils Absolute: 5.9 10*3/uL (ref 1.4–7.0)
Neutrophils: 68 %
Platelets: 352 10*3/uL (ref 150–450)
RBC: 5.41 x10E6/uL — ABNORMAL HIGH (ref 3.77–5.28)
RDW: 14.4 % (ref 11.7–15.4)
WBC: 8.6 10*3/uL (ref 3.4–10.8)

## 2023-10-03 LAB — COMPREHENSIVE METABOLIC PANEL
ALT: 25 [IU]/L (ref 0–32)
AST: 20 [IU]/L (ref 0–40)
Albumin: 4.3 g/dL (ref 4.0–5.0)
Alkaline Phosphatase: 140 [IU]/L — ABNORMAL HIGH (ref 44–121)
BUN/Creatinine Ratio: 6 — ABNORMAL LOW (ref 9–23)
BUN: 6 mg/dL (ref 6–20)
Bilirubin Total: 0.3 mg/dL (ref 0.0–1.2)
CO2: 24 mmol/L (ref 20–29)
Calcium: 9.4 mg/dL (ref 8.7–10.2)
Chloride: 103 mmol/L (ref 96–106)
Creatinine, Ser: 1.01 mg/dL — ABNORMAL HIGH (ref 0.57–1.00)
Globulin, Total: 2.6 g/dL (ref 1.5–4.5)
Glucose: 79 mg/dL (ref 70–99)
Potassium: 4.3 mmol/L (ref 3.5–5.2)
Sodium: 141 mmol/L (ref 134–144)
Total Protein: 6.9 g/dL (ref 6.0–8.5)
eGFR: 80 mL/min/{1.73_m2} (ref >59)

## 2023-10-03 LAB — LIPASE: Lipase: 28 U/L (ref 14–72)

## 2023-10-05 ENCOUNTER — Telehealth: Payer: Self-pay | Admitting: Family Medicine

## 2023-10-05 NOTE — Telephone Encounter (Signed)
Pt called in for lab results, not notes showing yet, Please cb when they are in

## 2023-10-09 ENCOUNTER — Telehealth: Payer: Self-pay

## 2023-10-09 ENCOUNTER — Other Ambulatory Visit: Payer: Self-pay | Admitting: Family Medicine

## 2023-10-09 DIAGNOSIS — R1084 Generalized abdominal pain: Secondary | ICD-10-CM

## 2023-10-09 NOTE — Telephone Encounter (Signed)
Pt calling back for lab results, advised Jessica Hall, [facility]EC agent to advise pt that message has been passed back to Dr. Sherrie Mustache for review and hopefully nurse can FU with pt today regarding results.

## 2023-10-09 NOTE — Telephone Encounter (Signed)
Pt's mom states it has been a week and they have not heard from the dr about pt's lab results.  She said :I don't want  to have to come up there". '

## 2023-10-09 NOTE — Telephone Encounter (Signed)
See result note. Needs abdominal ultrasound.

## 2023-10-09 NOTE — Telephone Encounter (Signed)
Copied from CRM [id number]. Topic: General - Other >> Oct 06, 2023  3:06 PM Epimenio Foot F wrote: Reason for CRM: Pt is calling in for her lab results. Please follow up with pt.

## 2023-10-11 ENCOUNTER — Ambulatory Visit
Admission: RE | Admit: 2023-10-11 | Discharge: 2023-10-11 | Disposition: A | Discharge status: Home / Self Care | Payer: 59 | Source: Ambulatory Visit | Attending: Family Medicine | Admitting: Family Medicine

## 2023-10-11 DIAGNOSIS — R109 Unspecified abdominal pain: Secondary | ICD-10-CM | POA: Diagnosis not present

## 2023-10-11 DIAGNOSIS — R1084 Generalized abdominal pain: Secondary | ICD-10-CM

## 2023-10-12 ENCOUNTER — Ambulatory Visit: Payer: Self-pay

## 2023-10-12 ENCOUNTER — Encounter: Payer: Self-pay | Admitting: Emergency Medicine

## 2023-10-12 ENCOUNTER — Other Ambulatory Visit: Payer: Self-pay

## 2023-10-12 ENCOUNTER — Emergency Department
Admission: EM | Admit: 2023-10-12 | Discharge: 2023-10-12 | Disposition: A | Discharge status: Home / Self Care | Payer: 59 | Attending: Emergency Medicine | Admitting: Emergency Medicine

## 2023-10-12 DIAGNOSIS — R1084 Generalized abdominal pain: Secondary | ICD-10-CM

## 2023-10-12 DIAGNOSIS — K580 Irritable bowel syndrome with diarrhea: Secondary | ICD-10-CM | POA: Insufficient documentation

## 2023-10-12 DIAGNOSIS — R197 Diarrhea, unspecified: Secondary | ICD-10-CM | POA: Diagnosis not present

## 2023-10-12 DIAGNOSIS — K589 Irritable bowel syndrome without diarrhea: Secondary | ICD-10-CM | POA: Diagnosis not present

## 2023-10-12 DIAGNOSIS — R1030 Lower abdominal pain, unspecified: Secondary | ICD-10-CM

## 2023-10-12 LAB — COMPREHENSIVE METABOLIC PANEL
ALT: 30 U/L (ref 0–44)
AST: 28 U/L (ref 15–41)
Albumin: 4.6 g/dL (ref 3.5–5.0)
Alkaline Phosphatase: 125 U/L (ref 38–126)
Anion gap: 7 (ref 5–15)
BUN: 7 mg/dL (ref 6–20)
CO2: 27 mmol/L (ref 22–32)
Calcium: 9.5 mg/dL (ref 8.9–10.3)
Chloride: 105 mmol/L (ref 98–111)
Creatinine, Ser: 1.03 mg/dL — ABNORMAL HIGH (ref 0.44–1.00)
GFR, Estimated: >60 mL/min (ref >60)
Glucose, Bld: 119 mg/dL — ABNORMAL HIGH (ref 70–99)
Potassium: 4.2 mmol/L (ref 3.5–5.1)
Sodium: 139 mmol/L (ref 135–145)
Total Bilirubin: 0.5 mg/dL (ref <1.2)
Total Protein: 7.7 g/dL (ref 6.5–8.1)

## 2023-10-12 LAB — CBC
HCT: 45.3 % (ref 36.0–46.0)
Hemoglobin: 14.6 g/dL (ref 12.0–15.0)
MCH: 25.6 pg — ABNORMAL LOW (ref 26.0–34.0)
MCHC: 32.2 g/dL (ref 30.0–36.0)
MCV: 79.3 fL — ABNORMAL LOW (ref 80.0–100.0)
Platelets: 361 10*3/uL (ref 150–400)
RBC: 5.71 MIL/uL — ABNORMAL HIGH (ref 3.87–5.11)
RDW: 14.7 % (ref 11.5–15.5)
WBC: 8.4 10*3/uL (ref 4.0–10.5)
nRBC: 0 % (ref 0.0–0.2)

## 2023-10-12 LAB — LIPASE, BLOOD: Lipase: 30 U/L (ref 11–51)

## 2023-10-12 MED ORDER — DICYCLOMINE HCL 10 MG PO CAPS: 10.0000 mg | ORAL_CAPSULE | Freq: Three times a day (TID) | ORAL | 0 refills | Status: DC

## 2023-10-12 MED ORDER — DICYCLOMINE HCL 10 MG PO CAPS
10.0000 mg | ORAL_CAPSULE | Freq: Once | ORAL | Status: AC
  Administered 2023-10-12: 10 mg via ORAL
  Filled 2023-10-12: qty 1

## 2023-10-12 NOTE — ED Triage Notes (Signed)
Patient to ED via POV for abd pain. Seen at PCP and had [place] of gallbladder- came back normal. Pain worse when eating- ongoing x1 year. Prescribed omeprazole but has stopped taking due to not helping.

## 2023-10-12 NOTE — Discharge Instructions (Addendum)
Your exam and labs are reassuring at this time.  Symptoms likely represent IBS as well as GERD.  Take the prescription meds as directed.  Follow-up with primary provider or gastroenterology as discussed.

## 2023-10-12 NOTE — ED Provider Notes (Signed)
[facility] Emergency Department Provider Note     Event Date/Time   First MD Initiated Contact with Patient 10/12/23 1647     (approximate)   History   Abdominal Pain   HPI  Jessica Hall is a 22 y.o. female with a history of ADHD, IBS, and GERD, presents to the ED for evaluation of chronic abdominal pain.  Patient reports greater than a year of symptoms.  She would describe lower abdominal pain after eating.  She was evaluated by PCP and had a recent ultrasound of the right upper quadrant which was negative for any findings.  Patient has previously been prescribed omeprazole but denies taking that medication due to limited benefit of her lower abdominal discomfort.  She would endorse intermittent episodes of loose stools, but denies any current medication management for her IBS diagnosis.  No fevers, chills, sweats, chest pain, shortness breath reported.  No nausea, vomiting, bowel changes reported.   Physical Exam   Triage Vital Signs: ED Triage Vitals  Encounter Vitals Group     BP 10/12/23 1535 (!) 136/95     Systolic BP Percentile --      Diastolic BP Percentile --      Pulse Rate 10/12/23 1535 86     Resp 10/12/23 1535 18     Temp 10/12/23 1535 98.5 F (36.9 C)     Temp Source 10/12/23 1535 Oral     SpO2 10/12/23 1535 98 %     Weight 10/12/23 1536 220 lb (99.8 kg)     Height 10/12/23 1536 5\' 4"  (1.626 m)     Head Circumference --      Peak Flow --      Pain Score 10/12/23 1536 5     Pain Loc --      Pain Education --      Exclude from Growth Chart --     Most recent vital signs: Vitals:   10/12/23 1535  BP: (!) 136/95  Pulse: 86  Resp: 18  Temp: 98.5 F (36.9 C)  SpO2: 98%    General Awake, no distress. NAD HEENT NCAT. PERRL. EOMI. No rhinorrhea. Mucous membranes are moist. CV:  Good peripheral perfusion. RRR RESP:  Normal effort. CTA ABD:  No distention.  Soft and nontender.  Normal bowel sounds x 4.  No rebound, guarding,  or rigidity appreciated.   ED Results / Procedures / Treatments   Labs (all labs ordered are listed, but only abnormal results are displayed) Labs Reviewed  COMPREHENSIVE METABOLIC PANEL - Abnormal; Notable for the following components:      Result Value   Glucose, Bld 119 (*)    Creatinine, Ser 1.03 (*)    All other components within normal limits  CBC - Abnormal; Notable for the following components:   RBC 5.71 (*)    MCV 79.3 (*)    MCH 25.6 (*)    All other components within normal limits  LIPASE, BLOOD  URINALYSIS, ROUTINE W REFLEX MICROSCOPIC  POC URINE PREG, ED     EKG   RADIOLOGY   No results found.   PROCEDURES:  Critical Care performed: No  Procedures   MEDICATIONS ORDERED IN ED: Medications  dicyclomine (BENTYL) capsule 10 mg (10 mg Oral Given 10/12/23 1720)     IMPRESSION / MDM / ASSESSMENT AND PLAN / ED COURSE  I reviewed the triage vital signs and the nursing notes.  Differential diagnosis includes, but is not limited to, biliary disease (biliary colic, acute cholecystitis, cholangitis, choledocholithiasis, etc), intrathoracic causes for epigastric abdominal pain including ACS, gastritis, duodenitis, pancreatitis, small bowel or large bowel obstruction, IBS, colitis, diverticulitis, abdominal aortic aneurysm, hernia, and ulcer(s).  Patient's presentation is most consistent with acute, uncomplicated illness.  Patient's diagnosis is consistent with lower abdominal discomfort with intermittent diarrhea, likely representing IBS.  Patient presents in no acute distress, no signs of dehydration, toxic appearance, or acute abdominal process.  No lab abnormalities or abnormal vital signs noted.  Patient with a reassuring exam and workup at this time.  Recent ultrasound of the right upper quadrant negative for any evidence of gallbladder disease or gallstones.  Patient will be discharged home with prescriptions for Bentyl.   Patient is also encouraged to take her previously prescribed omeprazole as directed.  Patient is to follow up with GI medicine as discussed, as needed or otherwise directed. Patient is given ED precautions to return to the ED for any worsening or new symptoms.   FINAL CLINICAL IMPRESSION(S) / ED DIAGNOSES   Final diagnoses:  Lower abdominal pain  Irritable bowel syndrome with diarrhea     Rx / DC Orders   ED Discharge Orders          Ordered    dicyclomine (BENTYL) 10 MG capsule  3 times daily before meals & bedtime        10/12/23 1709             Note:  This document was prepared using Dragon voice recognition software and Islam include unintentional dictation errors.    Lissa Hoard, PA-C 10/12/23 1814    Janith Lima, MD 10/16/23 [phone]

## 2023-10-12 NOTE — Telephone Encounter (Signed)
  Chief Complaint: pt took 3 btes of food and developed sudden severe pain.  Symptoms: h/o diarrheapain ihad decreased to a 5/10 Frequency: today Pertinent Negatives: Patient denies vomitng.  Disposition: [] ED /[] Urgent Care (no appt availability in office) / [] Appointment(In office/virtual)/ []  [place] [facility]/ [] Home Care/ [] Refused Recommended Disposition /[] [facility]/ []  Follow-up with PCP Additional Notes: pt wanting GI referral. And meds called in ASAP per pt. Pt stated previous meds have not helped. Advised will notify Dr Sherrie Mustache and advised to go to ED if pain worsens. [place] results given . Reason for Disposition  Abdominal pain is a chronic symptom (recurrent or ongoing AND present > 4 weeks)  Answer Assessment - Initial Assessment Questions 1. LOCATION: "Where does it hurt?"      Middle of stomach after eating 2. RADIATION: "Does the pain shoot anywhere else?" (e.g., chest, back)     no 3. ONSET: "When did the pain begin?" (e.g., minutes, hours or days ago)      Today  4. SUDDEN: "Gradual or sudden onset?"     sudden 5. PATTERN "Does the pain come and go, or is it constant?"    - If it comes and goes: "How long does it last?" "Do you have pain now?"     (Note: Comes and goes means the pain is intermittent. It goes away completely between bouts.)    - If constant: "Is it getting better, staying the same, or getting worse?"      (Note: Constant means the pain never goes away completely; most serious pain is constant and gets worse.)      yes 6. SEVERITY: "How bad is the pain?"  (e.g., Scale 1-10; mild, moderate, or severe)    - MILD (1-3): Doesn't interfere with normal activities, abdomen soft and not tender to touch.     - MODERATE (4-7): Interferes with normal activities or awakens from sleep, abdomen tender to touch.     - SEVERE (8-10): Excruciating pain, doubled over, unable to do any normal activities.       5 7. RECURRENT SYMPTOM: "Have you ever had this  type of stomach pain before?" If Yes, ask: "When was the last time?" and "What happened that time?"      no 8. CAUSE: "What do you think is causing the stomach pain?"     Gall bladder 9. RELIEVING/AGGRAVATING FACTORS: "What makes it better or worse?" (e.g., antacids, bending or twisting motion, bowel movement)     *No Answer* 10. OTHER SYMPTOMS: "Do you have any other symptoms?" (e.g., back pain, diarrhea, fever, urination pain, vomiting)       dirarrhea 11. PREGNANCY: "Is there any chance you are pregnant?" "When was your last menstrual period?"       *No Answer*  Protocols used: Abdominal Pain - [facility]

## 2023-10-12 NOTE — ED Notes (Signed)
See triage notes. Patient c/o being unable to really eat without pain and fullness. Patient has hx of IBS.

## 2023-10-16 NOTE — Addendum Note (Signed)
Addended by: Malva Limes on: 10/16/2023 11:32 AM   Modules accepted: Orders

## 2023-10-23 ENCOUNTER — Ambulatory Visit: Payer: 59 | Admitting: Family Medicine

## 2023-10-23 ENCOUNTER — Encounter: Payer: Self-pay | Admitting: Family Medicine

## 2023-10-23 VITALS — BP 115/89 | HR 74 | Temp 98.5°F | Ht 64.0 in | Wt 199.0 lb

## 2023-10-23 DIAGNOSIS — T3795XA Adverse effect of unspecified systemic anti-infective and antiparasitic, initial encounter: Secondary | ICD-10-CM | POA: Insufficient documentation

## 2023-10-23 DIAGNOSIS — Z3046 Encounter for surveillance of implantable subdermal contraceptive: Secondary | ICD-10-CM | POA: Insufficient documentation

## 2023-10-23 DIAGNOSIS — Z3009 Encounter for other general counseling and advice on contraception: Secondary | ICD-10-CM | POA: Insufficient documentation

## 2023-10-23 NOTE — Progress Notes (Signed)
Established patient visit   Patient: Jessica Hall   DOB: 12-22-2000   22 y.o. Female  MRN: [id number] Visit Date: 10/23/2023  Today's healthcare provider: Sherlyn Hay, DO   Chief Complaint  Patient presents with   Contraception removal    Patient presents for removal of Nexplanon.  Patient does not want to be on any contraception at this time.  She feels she needs to let her body rest.   Subjective    HPI The patient presents for removal of nexplanon contraceptive device after a little over a year of use. The primary reason for removal is dissatisfaction with the irregular menstrual cycle, with periods occurring twice a month. The patient reports no other health issues related to the implant.  The patient has not used any other form of birth control before the implant. She expresses interest in switching to oral contraceptives in the future and believes she would be able to take them on a reliable schedule.  During the procedure, the patient experiences some discomfort and pressure but denies pain. After the removal, the patient reported a new rash on the arm, which is suspected to be a potential reaction to iodine used in the procedure. The patient denies any known allergies.  The patient indicates a current lack of sexual activity and plans to consider birth control options when ready to date again.     Medications: Outpatient Medications Prior to Visit  Medication Sig   amphetamine-dextroamphetamine (ADDERALL XR) 10 MG 24 hr capsule Take 1 capsule (10 mg total) by mouth daily.   celecoxib (CELEBREX) 100 MG capsule Take 1 capsule (100 mg total) by mouth 2 (two) times daily.   clonazePAM (KLONOPIN) 0.5 MG tablet Take 1 tablet (0.5 mg total) by mouth daily.   dicyclomine (BENTYL) 10 MG capsule Take 1 capsule (10 mg total) by mouth 4 (four) times daily -  before meals and at bedtime.   Docusate Calcium (STOOL SOFTENER PO) Take by mouth.   famotidine (PEPCID) 20 MG tablet  TAKE 1 TABLET BY MOUTH EVERY DAY   omeprazole (PRILOSEC) 40 MG capsule Take 1 capsule (40 mg total) by mouth daily.   hyoscyamine (LEVSIN/SL) 0.125 MG SL tablet Place 1 tablet (0.125 mg total) under the tongue every 4 (four) hours as needed. (Patient not taking: Reported on 10/23/2023)   No facility-administered medications prior to visit.        Objective    BP 115/89 (BP Location: Left Arm, Patient Position: Sitting, Cuff Size: Normal)   Pulse 74   Temp 98.5 F (36.9 C) (Oral)   Ht 5\' 4"  (1.626 m)   Wt 199 lb (90.3 kg)   SpO2 100%   BMI 34.16 kg/m     Physical Exam Vitals and nursing note reviewed.  Constitutional:      General: She is not in acute distress.    Appearance: Normal appearance.  HENT:     Head: Normocephalic and atraumatic.  Eyes:     General: No scleral icterus.    Conjunctiva/sclera: Conjunctivae normal.  Cardiovascular:     Rate and Rhythm: Normal rate.  Pulmonary:     Effort: Pulmonary effort is normal.  Skin:    Comments: Implant palpable under skin of right upper arm  Neurological:     Mental Status: She is alert and oriented to person, place, and time. Mental status is at baseline.  Psychiatric:        Mood and Affect: Mood normal.  Behavior: Behavior normal.      No results found for any visits on 10/23/23.  Assessment & Plan    Nexplanon removal Assessment & Plan: Presents for removal of Nexplanon implant due to irregular periods and desire to switch birth control methods. Implant in place for over a year. Discussed standard risks including infection, bleeding, and potential damage to nearby structures. Explained use of Steri-Strips for wound care and importance of not removing them prematurely. Informed consent obtained. Agreed to have an observer for training purposes.   - Remove Nexplanon implant   - Apply Steri-Strip to the wound   - Instruct to keep the pressure dressing on for 24 hours and change the top dressing twice a day  if soiled    Orders: -     Remove drug implant device  Allergic reaction due to anti-infective agent Assessment & Plan: Area of erythema formed on upper right arm and top of right shoulder after removal of nexplanon with povidone iodine anti-infective agent. No similar reactions in past, making reaction to lidocaine unlikely.  - Monitor for further allergic reactions   - Consider taking diphenhydramine if symptoms persist     Birth control counseling Assessment & Plan: Pt considering switching to oral contraceptive pills (OCPs) after Nexplanon removal. Discussed importance of taking the pill at the same time every day to avoid breakthrough bleeding. Explained differences between combined OCPs and the mini-pill, recommending the combined pill for its forgiving nature if a dose is not taken precisely on time. Also briefly discussed IUDs as an option, which patient is not interested in at this time. Advised setting an alarm to ensure consistent daily intake. Discussed immediate return of fertility post-removal and need for backup contraception.   - Discuss options for combined OCPs; patient would like to defer start until she is ready for a relationship again. - Recommend backup contraception immediately if sexually active     Remove drug implant device  Date/Time: 10/23/2023 3:47 PM  Performed by: Sherlyn Hay, DO Authorized by: Sherlyn Hay, DO  Local anesthesia used: yes Anesthesia: local infiltration  Anesthesia: Local anesthesia used: yes Local Anesthetic: lidocaine 1% with epinephrine Anesthetic total: 1.5 mL  Sedation: Patient sedated: no  Patient tolerance: patient tolerated the procedure well with no immediate complications   Procedure: Nexplanon removal Description: Lidocaine was administered. An incision was made. Hemostats were used to grasp the Nexplanon. The Nexplanon was removed. Minimal bleeding was noted. A Steri-Strip was applied to the incision  site. Informed Consent obtained prior to procedure: Counseled about risks of infection, bleeding, and potential damage to nearby structures. Explanation of Steri-Strip application and care.   Return if symptoms worsen or fail to improve; when ready to pursue alternative contraceptive option.      I discussed the assessment and treatment plan with the patient  The patient was provided an opportunity to ask questions and all were answered. The patient agreed with the plan and demonstrated an understanding of the instructions.   The patient was advised to call back or seek an in-person evaluation if the symptoms worsen or if the condition fails to improve as anticipated.    Sherlyn Hay, DO  [facility] [phone] (phone) [phone] (fax)  [facility]

## 2023-10-23 NOTE — Patient Instructions (Signed)
You should keep the top bandage on for 24 hours and the smaller bandage clean and dry for 3 to 5 days. You can get pregnant right away after the implant is removed, so you should use another form of birth control if you don't want to get pregnant. If you want to continue using birth control, you can get another Nexplanon put in during the removal of the old one.

## 2023-10-23 NOTE — Assessment & Plan Note (Addendum)
Area of erythema formed on upper right arm and top of right shoulder after removal of nexplanon with povidone iodine anti-infective agent. No similar reactions in past, making reaction to lidocaine unlikely.  - Monitor for further allergic reactions   - Consider taking diphenhydramine if symptoms persist

## 2023-10-23 NOTE — Assessment & Plan Note (Signed)
Pt considering switching to oral contraceptive pills (OCPs) after Nexplanon removal. Discussed importance of taking the pill at the same time every day to avoid breakthrough bleeding. Explained differences between combined OCPs and the mini-pill, recommending the combined pill for its forgiving nature if a dose is not taken precisely on time. Also briefly discussed IUDs as an option, which patient is not interested in at this time. Advised setting an alarm to ensure consistent daily intake. Discussed immediate return of fertility post-removal and need for backup contraception.   - Discuss options for combined OCPs; patient would like to defer start until she is ready for a relationship again. - Recommend backup contraception immediately if sexually active

## 2023-10-23 NOTE — Assessment & Plan Note (Signed)
Presents for removal of Nexplanon implant due to irregular periods and desire to switch birth control methods. Implant in place for over a year. Discussed standard risks including infection, bleeding, and potential damage to nearby structures. Explained use of Steri-Strips for wound care and importance of not removing them prematurely. Informed consent obtained. Agreed to have an observer for training purposes.   - Remove Nexplanon implant   - Apply Steri-Strip to the wound   - Instruct to keep the pressure dressing on for 24 hours and change the top dressing twice a day if soiled

## 2023-11-01 ENCOUNTER — Ambulatory Visit: Payer: 59 | Admitting: Emergency Medicine

## 2023-11-01 VITALS — Ht 64.0 in | Wt 199.0 lb

## 2023-11-01 DIAGNOSIS — Z0000 Encounter for general adult medical examination without abnormal findings: Secondary | ICD-10-CM

## 2023-11-01 NOTE — Patient Instructions (Addendum)
Jessica Hall , Thank you for taking time to come for your Medicare Wellness Visit. I appreciate your ongoing commitment to your health goals. Please review the following plan we discussed and let me know if I can assist you in the future.   Referrals/Orders/Follow-Ups/Clinician Recommendations: Get a tetanus shot at your earliest convenience. This can be done at your local pharmacy or at Dr. Theodis Aguas office.   This is a list of the screening recommended for you and due dates:  Health Maintenance  Topic Date Due   HIV Screening  Never done   Hepatitis C Screening  Never done   Pap Smear  Never done   DTaP/Tdap/Td vaccine (6 - Td or Tdap) 08/03/2020   COVID-19 Vaccine (1 - 2023-24 season) Never done   Flu Shot  02/19/2024*   Medicare Annual Wellness Visit  10/31/2024   HPV Vaccine  Completed  *Topic was postponed. The date shown is not the original due date.    Advanced directives: (Declined) Advance directive discussed with you today. Even though you declined this today, please call our office should you change your mind, and we can give you the proper paperwork for you to fill out.  Next Medicare Annual Wellness Visit scheduled for next year: Yes, 11/06/24 @ 9:30am
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
## 2024-01-19 ENCOUNTER — Encounter: Payer: Self-pay | Admitting: Nurse Practitioner

## 2024-01-19 ENCOUNTER — Ambulatory Visit: Payer: 59 | Admitting: Nurse Practitioner

## 2024-01-19 VITALS — BP 118/72 | HR 102 | Temp 98.1°F | Resp 18 | Ht 64.0 in | Wt 202.5 lb

## 2024-01-19 DIAGNOSIS — R051 Acute cough: Secondary | ICD-10-CM

## 2024-01-19 DIAGNOSIS — J029 Acute pharyngitis, unspecified: Secondary | ICD-10-CM | POA: Diagnosis not present

## 2024-01-19 LAB — POC COVID19/FLU A&B COMBO
Covid Antigen, POC: NEGATIVE
Influenza A Antigen, POC: NEGATIVE
Influenza B Antigen, POC: NEGATIVE

## 2024-01-19 MED ORDER — BENZONATATE 100 MG PO CAPS: 200.0000 mg | ORAL_CAPSULE | Freq: Three times a day (TID) | ORAL | 0 refills | Status: DC | PRN

## 2024-01-19 NOTE — Patient Instructions (Signed)
 Flonase, zyrtec and mucinex

## 2024-01-19 NOTE — Progress Notes (Signed)
 BP 118/72   Pulse (!) 102   Temp 98.1 F (36.7 C)   Resp 18   Ht 5\' 4"  (1.626 m)   Wt 202 lb 8 oz (91.9 kg)   SpO2 98%   BMI 34.76 kg/m    Subjective:    Patient ID: Jessica Hall, female    DOB: 22-Jun-2001, 23 y.o.   MRN: [id number]  HPI: Nakima Fluegge Marston is a 23 y.o. female  Chief Complaint  Patient presents with   Allergic Rhinitis     Stuffy nose and scratchy throat onset 2 days   Patient presents to clinic with complaints of nasal congestion, scratch/sore throat, and unproductive cough x 2 days. Pt is not currently taking any medication to treat symptoms. Negative test results for covid and influenza.  URI Compliant: symptoms started 2 days ago -Fever: denies -Cough: present, unproductive -Shortness of breath: denies -Wheezing: denies -Chest congestion: denies -Nasal congestion: yes -Runny nose: yes -Post nasal drip: present -Sore throat: present -Sinus pressure: denies -Headache: denies -Face pain: denies -Ear pain:  denies -Ear pressure: denies -Relief with OTC cold/cough medications: currently not taking any medication     11/01/2023    9:52 AM 07/19/2023    2:33 PM 03/27/2023    1:23 PM  Depression screen PHQ 2/9  Decreased Interest 0 0 0  Down, Depressed, Hopeless 0 0 0  PHQ - 2 Score 0 0 0  Altered sleeping 0 0 0  Tired, decreased energy 0 0 0  Change in appetite 0 0 1  Feeling bad or failure about yourself  0 0 0  Trouble concentrating 0 0 0  Moving slowly or fidgety/restless 0 0 0  Suicidal thoughts 0 0 0  PHQ-9 Score 0 0 1  Difficult doing work/chores Not difficult at all Not difficult at all Not difficult at all    Relevant past medical, surgical, family and social history reviewed and updated as indicated. Interim medical history since our last visit reviewed. Allergies and medications reviewed and updated.  Review of Systems Ten systems reviewed and is negative except as mentioned in HPI        Objective:    BP 118/72   Pulse (!)  102   Temp 98.1 F (36.7 C)   Resp 18   Ht 5\' 4"  (1.626 m)   Wt 202 lb 8 oz (91.9 kg)   SpO2 98%   BMI 34.76 kg/m    Wt Readings from Last 3 Encounters:  01/19/24 202 lb 8 oz (91.9 kg)  11/01/23 199 lb (90.3 kg)  10/23/23 199 lb (90.3 kg)    Physical Exam Vitals reviewed.  Constitutional:      Appearance: Normal appearance.  HENT:     Head: Normocephalic.     Right Ear: Tympanic membrane normal.     Left Ear: Tympanic membrane normal.     Nose: Nose normal.     Right Sinus: No maxillary sinus tenderness or frontal sinus tenderness.     Left Sinus: No maxillary sinus tenderness or frontal sinus tenderness.     Mouth/Throat:     Mouth: Mucous membranes are moist.     Pharynx: Oropharynx is clear. No oropharyngeal exudate or posterior oropharyngeal erythema.  Eyes:     Extraocular Movements: Extraocular movements intact.     Conjunctiva/sclera: Conjunctivae normal.     Pupils: Pupils are equal, round, and reactive to light.  Cardiovascular:     Rate and Rhythm: Normal rate and regular rhythm.  Pulmonary:     Effort: Pulmonary effort is normal.     Breath sounds: Normal breath sounds.  Lymphadenopathy:     Cervical: Cervical adenopathy present.  Skin:    General: Skin is warm and dry.  Neurological:     General: No focal deficit present.     Mental Status: She is alert and oriented to person, place, and time. Mental status is at baseline.  Psychiatric:        Mood and Affect: Mood normal.        Behavior: Behavior normal.        Thought Content: Thought content normal.        Judgment: Judgment normal.     Results for orders placed or performed in visit on 01/19/24  POC Covid19/Flu A&B Antigen   Collection Time: 01/19/24  1:34 PM  Result Value Ref Range   Influenza A Antigen, POC Negative Negative   Influenza B Antigen, POC Negative Negative   Covid Antigen, POC Negative Negative       Assessment & Plan:   Problem List Items Addressed This Visit    None Visit Diagnoses       Sore throat    -  Primary   Relevant Orders   POC Covid19/Flu A&B Antigen (Completed)     Acute cough       tessalon perles prescription sent for symptom management   Relevant Medications   benzonatate (TESSALON PERLES) 100 MG capsule      Pt also encouraged to take OTC zyrtec, mucinex, flonase, and delsum as needed. Encouraged to drink plenty of fluids, and get plenty of rest.   Follow up plan: Return if symptoms worsen or fail to improve.

## 2024-03-13 ENCOUNTER — Ambulatory Visit: Payer: Self-pay

## 2024-03-13 NOTE — Telephone Encounter (Signed)
   Chief Complaint: vaginal symptoms  Symptoms: redness and itching  Disposition: [] ED /[] Urgent Care (no appt availability in office) / [x] Appointment(In office/virtual)/ []  [facility]/ [] Home Care/ [] Refused Recommended Disposition /[] [facility]/ []  Follow-up with PCP Additional Notes: Pt complaining of vaginal itching/redness for 3 weeks. Pt has tried OTC cream and stated it helped some but wont go away. Pt stated she was white discharge. Denies smell. Pt requested appt tomorrow. Appt is 4/24 @ 10. RN gave care advice and pt verbalized understanding.            Reason for Disposition  [1] Rash (e.g., redness, tiny bumps, sore) of genital area AND [2] present > 24 hours  Answer Assessment - Initial Assessment Questions 1. DISCHARGE: "Describe the discharge." (e.g., white, yellow, green, gray, foamy, cottage cheese-like)     white 2. ODOR: "Is there a bad odor?"     denies 3. ONSET: "When did the discharge begin?"     3 weeks  4. RASH: "Is there a rash in the genital area?" If Yes, ask: "Describe it." (e.g., redness, blisters, sores, bumps)     red 5. ABDOMEN PAIN: "Are you having any abdomen pain?" If Yes, ask: "What does it feel like? " (e.g., crampy, dull, intermittent, constant)      denies 6. ABDOMEN PAIN SEVERITY: If present, ask: "How bad is it?" (e.g., Scale 1-10; mild, moderate, or severe)   - MILD (1-3): Doesn't interfere with normal activities, abdomen soft and not tender to touch.    - MODERATE (4-7): Interferes with normal activities or awakens from sleep, abdomen tender to touch.    - SEVERE (8-10): Excruciating pain, doubled over, unable to do any normal activities. (R/O peritonitis)      denies 7. CAUSE: "What do you think is causing the discharge?" "Have you had the same problem before? What happened then?"     Yeast infection  8. OTHER SYMPTOMS: "Do you have any other symptoms?" (e.g., fever, itching, vaginal bleeding, pain with  urination, injury to genital area, vaginal foreign body)    Itching  9. PREGNANCY: "Is there any chance you are pregnant?" "When was your last menstrual period?"     denies  Protocols used: Vaginal Discharge-A-AH

## 2024-03-14 ENCOUNTER — Other Ambulatory Visit (HOSPITAL_COMMUNITY)
Admission: RE | Admit: 2024-03-14 | Discharge: 2024-03-14 | Disposition: A | Discharge status: Home / Self Care | Source: Ambulatory Visit | Attending: Family Medicine | Admitting: Family Medicine

## 2024-03-14 ENCOUNTER — Ambulatory Visit: Admitting: Physician Assistant

## 2024-03-14 ENCOUNTER — Ambulatory Visit (INDEPENDENT_AMBULATORY_CARE_PROVIDER_SITE_OTHER): Admitting: Family Medicine

## 2024-03-14 ENCOUNTER — Encounter: Payer: Self-pay | Admitting: Family Medicine

## 2024-03-14 VITALS — BP 112/84 | HR 102 | Ht 64.0 in | Wt 201.1 lb

## 2024-03-14 DIAGNOSIS — N898 Other specified noninflammatory disorders of vagina: Secondary | ICD-10-CM | POA: Diagnosis present

## 2024-03-14 MED ORDER — FLUCONAZOLE 150 MG PO TABS: 150.0000 mg | ORAL_TABLET | Freq: Once | ORAL | 0 refills | Status: AC

## 2024-03-14 NOTE — Progress Notes (Signed)
 Acute visit   Patient: Jessica Hall   DOB: 15-Jun-2001   22 y.o. Female  MRN: [id number] PCP: Lamon Pillow, MD   Chief Complaint  Patient presents with   Vaginal Itching    Pt complaining of vaginal itching/redness for 3 weeks. Pt has tried OTC cream and stated it helped some but wont go away. Pt stated she was white discharge. Denies smell and pain as well as possibility of pregnancy.    Subjective    Discussed the use of AI scribe software for clinical note transcription with the patient, who gave verbal consent to proceed.  History of Present Illness   The patient, with no known past medical history, presents with a three-week history of vaginal itching and redness. The symptoms are localized to the labia and do not extend to the internal vaginal area. The patient reports that the itching began after sitting in a sanitary pad for an extended period. The patient attempted self-treatment with an over-the-counter vaginal cream, which provided temporary relief but did not resolve the issue. The patient denies any abnormal vaginal discharge, noting only the presence of normal white discharge. The patient also denies any urinary symptoms such as dysuria, hematuria, or increased frequency. The patient has not been sexually active and has no concern for sexually transmitted diseases. The patient also mentions a long-standing anatomical abnormality in the vaginal area, which she describes as a small hole or slit. This abnormality is not associated with any discomfort or other symptoms.        Review of Systems   Objective    BP 112/84 (BP Location: Right Arm, Patient Position: Sitting, Cuff Size: Normal)   Pulse (!) 102   Ht 5\' 4"  (1.626 m)   Wt 201 lb 1.6 oz (91.2 kg)   LMP 02/11/2024 (Approximate)   SpO2 100%   BMI 34.52 kg/m   Physical Exam Vitals reviewed.  Constitutional:      General: She is not in acute distress.    Appearance: She is well-developed.  HENT:      Head: Normocephalic and atraumatic.  Eyes:     General: No scleral icterus.    Conjunctiva/sclera: Conjunctivae normal.  Cardiovascular:     Rate and Rhythm: Normal rate and regular rhythm.  Pulmonary:     Effort: Pulmonary effort is normal. No respiratory distress.  Genitourinary:    Comments: GYN:  External genitalia within normal limits, except for erythema of labia majora.  White thick discharge  Skin:    General: Skin is warm and dry.     Findings: No rash.  Neurological:     Mental Status: She is alert and oriented to person, place, and time.  Psychiatric:        Behavior: Behavior normal.       No results found for any visits on 03/14/24.  Assessment & Plan     Problem List Items Addressed This Visit   None Visit Diagnoses       Vaginal itching    -  Primary   Relevant Orders   Cervicovaginal ancillary only     Vaginal discharge               Candidiasis of vulva and vagina Candidiasis of the vulva and vagina with itching and redness for three weeks, localized to the external genitalia. Over-the-counter yeast cream was ineffective. Examination showed white discharge and skin irritation consistent with yeast infection. Differential diagnosis includes bacterial vaginosis, but clinical  presentation strongly suggests candidiasis. Discussed moisture and pad use as exacerbating factors and advised on cotton underwear to reduce moisture retention. Informed consent obtained for treatment with Diflucan , a single-dose oral medication that Harte take 1-2 days to alleviate symptoms. - Prescribe Diflucan  (fluconazole ) 150 mg oral tablet, single dose. - Recommend over-the-counter hydrocortisone cream for external itching, to be applied twice daily. - Perform swab test to confirm diagnosis, results expected in 1-2 days. - Send prescription to [facility] on [address].   Reassured about normal vulvar anatomy      Meds ordered this encounter  Medications   fluconazole   (DIFLUCAN ) 150 MG tablet    Sig: Take 1 tablet (150 mg total) by mouth once for 1 dose.    Dispense:  1 tablet    Refill:  0     Return if symptoms worsen or fail to improve.      Aden Agreste, MD  [facility] [phone] (phone) [phone] (fax)  [facility]

## 2024-03-15 LAB — CERVICOVAGINAL ANCILLARY ONLY
Bacterial Vaginitis (gardnerella): POSITIVE — AB
Candida Glabrata: NEGATIVE
Candida Vaginitis: POSITIVE — AB
Comment: NEGATIVE
Comment: NEGATIVE
Comment: NEGATIVE

## 2024-03-19 ENCOUNTER — Other Ambulatory Visit: Payer: Self-pay

## 2024-03-19 DIAGNOSIS — N898 Other specified noninflammatory disorders of vagina: Secondary | ICD-10-CM

## 2024-03-19 DIAGNOSIS — B9689 Other specified bacterial agents as the cause of diseases classified elsewhere: Secondary | ICD-10-CM

## 2024-03-19 MED ORDER — METRONIDAZOLE 500 MG PO TABS: 500.0000 mg | ORAL_TABLET | Freq: Two times a day (BID) | ORAL | 0 refills | Status: AC

## 2024-04-19 ENCOUNTER — Ambulatory Visit (INDEPENDENT_AMBULATORY_CARE_PROVIDER_SITE_OTHER): Admitting: Family Medicine

## 2024-04-19 ENCOUNTER — Encounter: Payer: Self-pay | Admitting: Family Medicine

## 2024-04-19 VITALS — BP 111/71 | HR 81 | Resp 14 | Ht 64.0 in | Wt 203.7 lb

## 2024-04-19 DIAGNOSIS — F419 Anxiety disorder, unspecified: Secondary | ICD-10-CM

## 2024-04-19 DIAGNOSIS — F32A Depression, unspecified: Secondary | ICD-10-CM

## 2024-04-19 DIAGNOSIS — F4310 Post-traumatic stress disorder, unspecified: Secondary | ICD-10-CM

## 2024-04-19 DIAGNOSIS — K581 Irritable bowel syndrome with constipation: Secondary | ICD-10-CM

## 2024-04-19 MED ORDER — DICYCLOMINE HCL 10 MG PO CAPS: 10.0000 mg | ORAL_CAPSULE | Freq: Three times a day (TID) | ORAL | 1 refills | Status: AC

## 2024-05-01 ENCOUNTER — Telehealth: Payer: Self-pay | Admitting: Family Medicine

## 2024-05-01 DIAGNOSIS — F32A Depression, unspecified: Secondary | ICD-10-CM | POA: Insufficient documentation

## 2024-05-01 NOTE — Progress Notes (Signed)
 Established patient visit   Patient: Jessica Hall   DOB: 12/20/2000   22 y.o. Female  MRN: [id number] Visit Date: 04/19/2024  Today's healthcare provider: Jeralene Mom, MD   Chief Complaint  Patient presents with   Animal service    Ptsd, anxiety   Subjective    Discussed the use of AI scribe software for clinical note transcription with the patient, who gave verbal consent to proceed.  History of Present Illness   A 23 year old female presents for a follow-up visit and to obtain a letter for her service dog.  She has had a service dog for about three months, registered through the state, to provide emotional support during anxiety attacks. She requires a letter to verify the dog's status for public access.  She experiences ongoing abdominal pain, which is less severe than before. Previously, the pain was debilitating, causing her to be 'bent over and not wanting to walk.' Currently, the pain lasts about ten minutes and is manageable. She questions if her diet, possibly gluten, might be contributing to her symptoms. She takes dicyclomine  as needed for this pain but experiences side effects, including headaches and dizziness, particularly after her initial use post-hospital discharge. She only takes it when necessary due to these side effects. No severe symptoms such as blood in stool or mucus, which are associated with celiac disease. She experiences stomach pain after eating, which [place] suggest gluten sensitivity.       Medications: Outpatient Medications Prior to Visit  Medication Sig   clonazePAM  (KLONOPIN ) 0.5 MG tablet Take 1 tablet (0.5 mg total) by mouth daily. (Not taking as of 04/19/24)   Docusate Calcium (STOOL SOFTENER PO) Take by mouth.   famotidine  (PEPCID ) 20 MG tablet TAKE 1 TABLET BY MOUTH EVERY DAY   dicyclomine  (BENTYL ) 10 MG capsule Take 1 capsule (10 mg total) by mouth 4 (four) times daily -  before meals and at bedtime. (Patient not taking: Reported on  11/01/2023)   No facility-administered medications prior to visit.   Review of Systems  Constitutional:  Negative for appetite change, chills, fatigue and fever.  Respiratory:  Negative for chest tightness and shortness of breath.   Cardiovascular:  Negative for chest pain and palpitations.  Gastrointestinal:  Negative for abdominal pain, nausea and vomiting.  Neurological:  Negative for dizziness and weakness.       Objective    BP 111/71 (BP Location: Left Arm, Patient Position: Sitting, Cuff Size: Normal)   Pulse 81   Resp 14   Ht 5' 4 (1.626 m)   Wt 203 lb 11.2 oz (92.4 kg)   LMP 04/12/2024   SpO2 99%   BMI 34.97 kg/m   Physical Exam   General appearance: Mildly obese female, cooperative and in no acute distress Head: Normocephalic, without obvious abnormality, atraumatic Respiratory: Respirations even and unlabored, normal respiratory rate Extremities: All extremities are intact.  Skin: Skin color, texture, turgor normal. No rashes seen  Psych: Appropriate mood and affect. Neurologic: Mental status: Alert, oriented to person, place, and time, thought content appropriate.      Assessment & Plan     Anxiety and depression (Primary)  PTSD (post-traumatic stress disorder) Reports that service dog has been very helpful as her emotional support animal and requires its presence in public spaces for activities of daily living. Will compose letter for her to that effect.   Irritable bowel syndrome with constipation     IBS Intermittent pain, less severe. Possible gluten  sensitivity considered. Discussed celiac disease versus gluten sensitivity. Advised gluten elimination trial. - Continue dicyclomine  as needed. - Refill dicyclomine  prescription. - Consider gluten elimination diet for 1-2 weeks.  Lip bumps Non-painful lip bumps likely clogged glands. Advised against chapstick or strong ointments. - Apply antibiotic ointment such as Neosporin if needed.          Jeralene Mom, MD  [facility] [phone] (phone) [phone] (fax)  [facility]

## 2024-05-01 NOTE — Patient Instructions (Signed)
 Marland Kitchen  Please review the attached list of medications and notify my office if there are any errors.   . Please bring all of your medications to every appointment so we can make sure that our medication list is the same as yours.

## 2024-05-01 NOTE — Telephone Encounter (Signed)
 Patient letter regarding her emotional support animal is done and left at my workstation.

## 2024-05-01 NOTE — Telephone Encounter (Signed)
 Letter placed up front ready for pick up. Patient aware.

## 2024-09-03 ENCOUNTER — Ambulatory Visit

## 2024-09-03 ENCOUNTER — Other Ambulatory Visit: Payer: Self-pay

## 2024-09-03 ENCOUNTER — Other Ambulatory Visit (HOSPITAL_COMMUNITY)
Admission: RE | Admit: 2024-09-03 | Discharge: 2024-09-03 | Disposition: A | Discharge status: Home / Self Care | Source: Ambulatory Visit

## 2024-09-03 VITALS — BP 100/78 | HR 72 | Resp 16 | Ht 64.0 in | Wt 200.0 lb

## 2024-09-03 DIAGNOSIS — Z113 Encounter for screening for infections with a predominantly sexual mode of transmission: Secondary | ICD-10-CM | POA: Insufficient documentation

## 2024-09-03 DIAGNOSIS — Z202 Contact with and (suspected) exposure to infections with a predominantly sexual mode of transmission: Secondary | ICD-10-CM | POA: Diagnosis not present

## 2024-09-03 NOTE — Progress Notes (Signed)
 Acute visit   Patient: Jessica Hall   DOB: 21-Aug-2001   23 y.o. Female  MRN: [id number] PCP: [name], MD   Chief Complaint  Patient presents with   Acute Visit     possible exposure to syphiliis   Subjective    Discussed the use of AI scribe software for clinical note transcription with the patient, who gave verbal consent to proceed.  History of Present Illness Jessica Hall is a 23 year old female who presents with concerns of potential STD exposure.  She had a recent encounter involving exposure to saliva from a partner who later disclosed having syphilis. The following day, she developed a sore throat and noticed white spots on her tonsils, raising concerns about possible STD exposure. She has not engaged in sexual intercourse with the partner, only kissing.  Her sore throat resolved after gargling with salt water, and the white spots on her tonsils are no longer present. She currently has no throat pain, fatigue, fever, chills, or new skin lesions or rashes.  She is not on any steroid medications. She is taking medication for her stomach, though the specific medication is not mentioned.  No current throat pain, fatigue, fever, chills, new lesions in the groin area, or any rashes on her hands or legs.  Review of systems as noted in HPI.   Objective    BP 100/78 (BP Location: Left Arm, Patient Position: Sitting, Cuff Size: Normal)   Pulse 72   Resp 16   Ht 5' 4 (1.626 m)   Wt 200 lb (90.7 kg)   SpO2 100%   BMI 34.33 kg/m  Physical Exam Constitutional:      Appearance: Normal appearance.  HENT:     Head: Normocephalic and atraumatic.     Mouth/Throat:     Mouth: Mucous membranes are moist.     Pharynx: No oropharyngeal exudate or posterior oropharyngeal erythema.  Eyes:     Pupils: Pupils are equal, round, and reactive to light.  Pulmonary:     Effort: Pulmonary effort is normal.  Skin:    General: Skin is warm.     Findings: No rash.   Neurological:     General: No focal deficit present.     Mental Status: She is alert.       No results found for any visits on 09/03/24.  Assessment & Plan     Problem List Items Addressed This Visit       Other   Possible exposure to STD - Primary   Relevant Orders   RPR   HIV Antibody (routine testing w rflx)   Cervicovaginal ancillary only   Hepatitis C antibody   Cervicovaginal ancillary only    Assessment & Plan Exposure to syphilis Patient with recent exposure to patient with known syphilis. Denies sexual contact, but did engage in kissing. Patient is asymptomatic at this time, although did note a sore throat shortly after exposure. Denies sores or rashes. Discussed that there is low transmission risk of syphilis through saliva. Will screen for STDs as below and treat as indicated. - Order blood work for syphilis, HIV, G/C (oral and vaginal), and hepatitis C. - Refer to health department for penicillin if syphilis test is positive. - Advise abstinence from sexual activity until results are confirmed. - Reassured about low transmission risk through saliva.   No orders of the defined types were placed in this encounter.    No follow-ups on file.  Isaiah DELENA Pepper, MD  [facility] [phone] (phone) [phone] (fax)

## 2024-09-04 ENCOUNTER — Ambulatory Visit: Payer: Self-pay

## 2024-09-04 LAB — CERVICOVAGINAL ANCILLARY ONLY
Chlamydia: NEGATIVE
Chlamydia: NEGATIVE
Comment: NEGATIVE
Comment: NORMAL
Comment: NEGATIVE
Comment: NEGATIVE
Comment: NORMAL
Neisseria Gonorrhea: NEGATIVE
Neisseria Gonorrhea: NEGATIVE
Trichomonas: NEGATIVE

## 2024-09-04 LAB — HEPATITIS C ANTIBODY: Hep C Virus Ab: NONREACTIVE

## 2024-09-04 LAB — RPR: RPR Ser Ql: NONREACTIVE

## 2024-09-04 LAB — HIV ANTIBODY (ROUTINE TESTING W REFLEX): HIV Screen 4th Generation wRfx: NONREACTIVE

## 2024-09-11 ENCOUNTER — Encounter: Payer: Self-pay | Admitting: Family Medicine

## 2024-09-11 ENCOUNTER — Ambulatory Visit (INDEPENDENT_AMBULATORY_CARE_PROVIDER_SITE_OTHER): Admitting: Family Medicine

## 2024-09-11 VITALS — BP 118/60 | HR 60 | Resp 16 | Wt 205.0 lb

## 2024-09-11 DIAGNOSIS — F419 Anxiety disorder, unspecified: Secondary | ICD-10-CM | POA: Diagnosis not present

## 2024-09-11 DIAGNOSIS — F320 Major depressive disorder, single episode, mild: Secondary | ICD-10-CM

## 2024-09-11 MED ORDER — VENLAFAXINE HCL ER 37.5 MG PO CP24: 37.5000 mg | ORAL_CAPSULE | Freq: Every day | ORAL | 0 refills | Status: DC

## 2024-09-11 NOTE — Progress Notes (Addendum)
 Established patient visit   Patient: Jessica Hall   DOB: 04/27/2001   23 y.o. Female  MRN: [id number] Visit Date: 09/11/2024  Today's healthcare provider: [name], MD   Chief Complaint  Patient presents with   Referral to Counseling   Subjective    Discussed the use of AI scribe software for clinical note transcription with the patient, who gave verbal consent to proceed.  History of Present Illness   Jessica Hall is a 23 year old female who presents with depression and anxiety.  She experiences significant symptoms of depression and anxiety, including sadness, anger, stress, and nervousness. She wants to talk to someone about her mental health and is interested in counseling and medication. She has never taken an antidepressant before.  She is currently taking Adderall for ADHD, though she is unsure of the exact dose, and reports that it is 'doing okay.' Additionally, she takes dicyclomine  for stomach issues.  There is a family history of anxiety, as her sister also experiences anxiety related to her brother's passing and her father's presence, which has previously triggered a panic attack for her. No frequent panic attacks, noting only one instance related to her father's presence.        09/11/2024    9:42 AM 04/19/2024    2:46 PM 11/01/2023    9:52 AM  PHQ9 SCORE ONLY  PHQ-9 Total Score 12 0  0      Data saved with a previous flowsheet row definition     Medications: Outpatient Medications Prior to Visit  Medication Sig   dicyclomine  (BENTYL ) 10 MG capsule Take 1 capsule (10 mg total) by mouth 4 (four) times daily -  before meals and at bedtime.   Docusate Calcium (STOOL SOFTENER PO) Take by mouth.   famotidine  (PEPCID ) 20 MG tablet TAKE 1 TABLET BY MOUTH EVERY DAY   No facility-administered medications prior to visit.   Review of Systems  Constitutional:  Negative for appetite change, chills, fatigue and fever.  Respiratory:  Negative for chest  tightness and shortness of breath.   Cardiovascular:  Negative for chest pain and palpitations.  Gastrointestinal:  Negative for abdominal pain, nausea and vomiting.  Neurological:  Negative for dizziness and weakness.       Objective    BP 118/60 (BP Location: Left Arm, Patient Position: Sitting, Cuff Size: Normal)   Pulse 60   Resp 16   Wt 205 lb (93 kg)   LMP 09/09/2024 (Approximate)   SpO2 99%   BMI 35.19 kg/m   Physical Exam   General appearance: Mildly obese female, cooperative and in no acute distress Head: Normocephalic, without obvious abnormality, atraumatic Respiratory: Respirations even and unlabored, normal respiratory rate Extremities: All extremities are intact.  Skin: Skin color, texture, turgor normal. No rashes seen  Psych: Appropriate mood and affect. Neurologic: Mental status: Alert, oriented to person, place, and time, thought content appropriate.    Assessment & Plan    1. Anxiety (Primary)  - Amb ref to Integrated Behavioral Health - venlafaxine  XR (EFFEXOR  XR) 37.5 MG 24 hr capsule; Take 1 capsule (37.5 mg total) by mouth daily with breakfast.  Dispense: 30 capsule; Refill: 0  Advised she will likely need to titrate up to 75 in a few weeks. She will discuss with counselor and let me know when she is ready to titrate up.   2. Depression, major, single episode, mild  - Amb ref to Integrated Behavioral Health - venlafaxine  XR (  EFFEXOR  XR) 37.5 MG 24 hr capsule; Take 1 capsule (37.5 mg total) by mouth daily with breakfast.  Dispense: 30 capsule; Refill: 0  Patient and/or legal guardian verbally consented to [facility] services about presenting concerns and psychiatric consultation as appropriate.  The services will be billed as appropriate for the patient         [name], MD  [facility] [phone] (phone) [phone] (fax)  [facility]

## 2024-10-05 ENCOUNTER — Other Ambulatory Visit: Payer: Self-pay | Admitting: Family Medicine

## 2024-10-05 DIAGNOSIS — F320 Major depressive disorder, single episode, mild: Secondary | ICD-10-CM

## 2024-10-05 DIAGNOSIS — F419 Anxiety disorder, unspecified: Secondary | ICD-10-CM

## 2024-10-14 ENCOUNTER — Ambulatory Visit: Admitting: Licensed Clinical Social Worker

## 2024-10-14 DIAGNOSIS — F331 Major depressive disorder, recurrent, moderate: Secondary | ICD-10-CM

## 2024-10-14 DIAGNOSIS — F411 Generalized anxiety disorder: Secondary | ICD-10-CM

## 2024-10-14 NOTE — Patient Instructions (Addendum)
 Using Behavioral Activation to manage stress/depression symptoms     Identify/understand your own mood triggers.   Structure your day--get up around the same time, eat meals/snacks around the same time, go to bed around the same time.   Purposefully schedule self care time and time to complete tasks. This can include quiet time.  Stimulate your brain--go for a walk, text/call a friend or family member, if you are indoors--go outside (and vice versa), go for a drive, go to a store with bright colors and bright lights. Try to do things in a different way--drive to your favorite places using an alternative route, or instead of starting on the right side of the grocery store when shopping, start on the left side. You might feel a bit uncomfortable doing things outside of the comfort zone, but this is helping the brain create new neural pathways and is very healthy for brain/emotional health.   Physical movement based on your ability. If you can go for a walk, do stretches, even waving your hands to music can trigger feel-good endorphins in the brain and help release physical tension we all hold in our bodies.  Even 5 minutes can make a difference.   Be intentional about doing things that bring you joy (or used to bring you joy), and look for the things in every day that make you happy.  Seek those glimmers of joy each day.  Set a timer for 5 minutes for a harder task (ex. Laundry, washing dishes).  Allow yourself to work distraction-free for 5 minutes, then stop when the timer goes off. If you need a break, take a break. If you want to continue working then set another timer for whatever time you choose.   Limit or eliminate substance use including alcohol, marijuana, or recreational use of prescription medication.  Let in the light!! Open the window blinds, curtains and let natural light in. Even sitting near a window or sitting outside can boost your mood, especially in the wintertime when  there is less daylight.   If you take medication to manage symptoms, remember to take all medications as they are prescribed (please read all labels!!)    Things to envision for ourselves to to improve inspiration, motivation, and initiative :  improving physical wellness, focus on family relationships, focusing on our own mental/emotional well being, being a part of a bigger community, finding a hobby, being a part of something that fosters personal growth, engaging socially with others (even digitally!!!)    ANXIETY/PANIC EPISODE MANAGEMENT (CBT/MINDFULNESS BASED)   If you are in a highly stimulating or triggering environment, change your location to a less stimulating or safer environment .   Stimulate your senses by tasting/eating a sour candy (such as a lemon drop) or a strong flavored cough drop.  This triggers smell, taste, and touch since we  have had lots of nerve endings inside of your mouth.   Practice slow, controlled breathing to avoid hyperventilation.  Breathing into your nose for the count of 4 inside your head, holding your breath for the count of 4 inside your head, and exhale slowly counting to 8 inside your head.  This is called 4-4-8 breathing, or triangle breathing. (Controlled breathing). This helps manage panic, anxiety, anger, and tearfulness.  Additional grounding exercises include rubbing your hands softly together, or wiggling toes inside your shoes, pretending to grasp the floor with your toes.    Listen to calming music or sounds  Count backwards in your head by  twos or tens or recite multiplication tables in your head.  Visualize a calming, happy place and identify 5 explicit details about this place (color, temperature, smells, visual details, etc.)  Splash cold water on your face or hold an ice cube in your hand (alternate hands)  Clench and release muscle groups (hands, shoulders, facial muscles)  Engage in soothing activities to recover after a  stressful/anxious episode such as: drinking a warm beverage, sitting in your favorite comfortable location, positive physical contact with a pet or weighted blanket, using positive self talk/positive affirmations.   Download PTSD Coach app for your phone or tablet--its free and has lots of tips to help you manage panic episodes no matter where you are!    Emergency Resources:  National Suicide & Crisis Lifeline: Call or text 988  Crisis Text Line: Text HOME to 332-080-6994  [facility]  [address] [place], [place] [address] [phone] or [phone] [facility] 24/7 FOR ANYONE [address], [place], [place]  [phone] Fax: [phone] [email or web address] *Interpreters available *Accepts all insurance and uninsured for Urgent Care needs *Accepts Medicaid and uninsured for outpatient treatment (below)
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
## 2024-10-16 ENCOUNTER — Telehealth: Payer: Self-pay | Admitting: Licensed Clinical Social Worker

## 2024-10-16 DIAGNOSIS — F411 Generalized anxiety disorder: Secondary | ICD-10-CM

## 2024-10-16 DIAGNOSIS — F330 Major depressive disorder, recurrent, mild: Secondary | ICD-10-CM
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
## 2024-11-06 ENCOUNTER — Ambulatory Visit: Payer: Self-pay

## 2024-11-06 VITALS — Ht 64.0 in | Wt 205.0 lb

## 2024-11-06 DIAGNOSIS — Z0000 Encounter for general adult medical examination without abnormal findings: Secondary | ICD-10-CM
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
## 2024-11-06 NOTE — Patient Instructions (Addendum)
 Jessica Hall,  Thank you for taking the time for your Medicare Wellness Visit. I appreciate your continued commitment to your health goals. Please review the care plan we discussed, and feel free to reach out if I can assist you further.  Please note that Annual Wellness Visits do not include a physical exam. Some assessments Debes be limited, especially if the visit was conducted virtually. If needed, we Aven recommend an in-person follow-up with your provider.  Ongoing Care Seeing your primary care provider every 3 to 6 months helps us  monitor your health and provide consistent, personalized care.    Referrals If a referral was made during today's visit and you haven't received any updates within two weeks, please contact the referred provider directly to check on the status.  Recommended Screenings:    Recommend getting a routine eye exam every 1-2 years. I have included a list of eye doctors in the area.   Health Maintenance  Topic Date Due   Pap Smear  Never done   DTaP/Tdap/Td vaccine (6 - Td or Tdap) 08/03/2020   COVID-19 Vaccine (1 - 2025-26 season) Never done   Medicare Annual Wellness Visit  10/31/2024   Flu Shot  02/18/2025*   HPV Vaccine  Completed   Hepatitis C Screening  Completed   HIV Screening  Completed   Pneumococcal Vaccine  Aged Out   Meningitis B Vaccine  Aged Out   Hepatitis B Vaccine  Discontinued  *Topic was postponed. The date shown is not the original due date.       11/06/2024    9:34 AM  Advanced Directives  Does Patient Have a Medical Advance Directive? No  Would patient like information on creating a medical advance directive? No - Patient declined    Vision: Annual vision screenings are recommended for early detection of glaucoma, cataracts, and diabetic retinopathy. These exams can also reveal signs of chronic conditions such as diabetes and high blood pressure.  Dental: Annual dental screenings help detect early signs of oral cancer, gum disease,  and other conditions linked to overall health, including heart disease and diabetes.  Please see the attached documents for additional preventive care recommendations.     There are several Eye Doctors in your area. Here are a few that usually accept all insurance types:  [facility] [address] [place], [place] [address] Phone: [phone]  [facility] [address] [place], [place] [address] Phone: [phone]  [facility] [address] [place], [place] [address] Phone: [phone]  [facility] [address] [place], [place] [address] Phone: [phone]  [facility] [address] [place], [place] [address] Phone: [phone]  [facility] [address] [place], [place] [address] Phone: [phone]

## 2024-11-11 ENCOUNTER — Ambulatory Visit: Admitting: Licensed Clinical Social Worker

## 2024-11-11 DIAGNOSIS — F411 Generalized anxiety disorder: Secondary | ICD-10-CM

## 2024-11-11 DIAGNOSIS — F331 Major depressive disorder, recurrent, moderate: Secondary | ICD-10-CM

## 2024-11-11 NOTE — BH Specialist Note (Signed)
 Jessica Hall

## 2024-11-11 NOTE — Patient Instructions (Signed)
 Using Behavioral Activation to manage stress/depression symptoms     Identify/understand your own mood triggers.   Structure your day--get up around the same time, eat meals/snacks around the same time, go to bed around the same time.   Purposefully schedule self care time and time to complete tasks. This can include quiet time.  Stimulate your brain--go for a walk, text/call a friend or family member, if you are indoors--go outside (and vice versa), go for a drive, go to a store with bright colors and bright lights. Try to do things in a different way--drive to your favorite places using an alternative route, or instead of starting on the right side of the grocery store when shopping, start on the left side. You might feel a bit uncomfortable doing things outside of the comfort zone, but this is helping the brain create new neural pathways and is very healthy for brain/emotional health.   Physical movement based on your ability. If you can go for a walk, do stretches, even waving your hands to music can trigger feel-good endorphins in the brain and help release physical tension we all hold in our bodies.  Even 5 minutes can make a difference.   Be intentional about doing things that bring you joy (or used to bring you joy), and look for the things in every day that make you happy.  Seek those glimmers of joy each day.  Set a timer for 5 minutes for a harder task (ex. Laundry, washing dishes).  Allow yourself to work distraction-free for 5 minutes, then stop when the timer goes off. If you need a break, take a break. If you want to continue working then set another timer for whatever time you choose.   Limit or eliminate substance use including alcohol, marijuana, or recreational use of prescription medication.  Let in the light!! Open the window blinds, curtains and let natural light in. Even sitting near a window or sitting outside can boost your mood, especially in the wintertime when  there is less daylight.   If you take medication to manage symptoms, remember to take all medications as they are prescribed (please read all labels!!)    Things to envision for ourselves to to improve inspiration, motivation, and initiative :  improving physical wellness, focus on family relationships, focusing on our own mental/emotional well being, being a part of a bigger community, finding a hobby, being a part of something that fosters personal growth, engaging socially with others (even digitally!!!)    ANXIETY/PANIC EPISODE MANAGEMENT (CBT/MINDFULNESS BASED)   If you are in a highly stimulating or triggering environment, change your location to a less stimulating or safer environment .   Stimulate your senses by tasting/eating a sour candy (such as a lemon drop) or a strong flavored cough drop.  This triggers smell, taste, and touch since we  have had lots of nerve endings inside of your mouth.   Practice slow, controlled breathing to avoid hyperventilation.  Breathing into your nose for the count of 4 inside your head, holding your breath for the count of 4 inside your head, and exhale slowly counting to 8 inside your head.  This is called 4-4-8 breathing, or triangle breathing. (Controlled breathing). This helps manage panic, anxiety, anger, and tearfulness.  Additional grounding exercises include rubbing your hands softly together, or wiggling toes inside your shoes, pretending to grasp the floor with your toes.    Listen to calming music or sounds  Count backwards in your head by  twos or tens or recite multiplication tables in your head.  Visualize a calming, happy place and identify 5 explicit details about this place (color, temperature, smells, visual details, etc.)  Splash cold water on your face or hold an ice cube in your hand (alternate hands)  Clench and release muscle groups (hands, shoulders, facial muscles)  Engage in soothing activities to recover after a  stressful/anxious episode such as: drinking a warm beverage, sitting in your favorite comfortable location, positive physical contact with a pet or weighted blanket, using positive self talk/positive affirmations.   Download PTSD Coach app for your phone or tablet--its free and has lots of tips to help you manage panic episodes no matter where you are!    ?? Self-Care Worksheet  Always prioritize medication compliance--if you are prescribed medications, please take medication as prescribed.   ?? Mental & Emotional Self-Care Practice mindfulness or meditation for 5-10 minutes daily Journal your thoughts or feelings Set boundaries to protect your emotional energy Talk to a therapist or counselor Engage in positive self-talk  ?? Physical Self-Care Get 7-9 hours of sleep each night Move your body (walk, stretch, dance, exercise) Eat nourishing meals and stay hydrated Take breaks to rest and recharge Schedule regular health check-ups  ?? Creative Self-Care Try a new hobby or craft Listen to or play music Draw, paint, or color Write poetry or stories Bluford or bake something new  ?? Social Self-Care Spend time with supportive friends or family Join a group or community with shared interests Say "no" when you need to Reach out to someone you trust Plan a fun outing or virtual hangout  ?? Spiritual Self-Care Reflect on your values and beliefs Spend time in nature Read spiritual or inspirational texts Practice gratitude daily Attend a place of worship or spiritual gathering  ?? Environmental Self-Care Adult Nurse a calming corner or sanctuary Use scents, lighting, or music to set a mood Organize your schedule or Scientist, Research (life Sciences) clutter     Emergency Resources:  National Suicide & Crisis Lifeline: Call or text 988  Crisis Text Line: Text HOME to (279) 413-6493  [facility]  [address] [place], [place] [address] [phone]  or [phone] WALK-IN URGENT [facility] 24/7 FOR ANYONE [address], [place], [place]  [phone] Fax: [phone] [email or web address] *Interpreters available *Accepts all insurance and uninsured for Urgent Care needs *Accepts Medicaid and uninsured for outpatient treatment (below)
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
## 2024-11-12 ENCOUNTER — Other Ambulatory Visit: Payer: Self-pay | Admitting: Family Medicine

## 2024-11-12 DIAGNOSIS — F320 Major depressive disorder, single episode, mild: Secondary | ICD-10-CM

## 2024-11-12 DIAGNOSIS — F419 Anxiety disorder, unspecified: Secondary | ICD-10-CM

## 2024-11-12 MED ORDER — VENLAFAXINE HCL ER 75 MG PO CP24: 75.0000 mg | ORAL_CAPSULE | Freq: Every day | ORAL | 3 refills | Status: DC

## 2024-11-12 MED ORDER — HYDROXYZINE HCL 10 MG PO TABS: ORAL_TABLET | ORAL | 2 refills | Status: AC

## 2024-11-18 ENCOUNTER — Ambulatory Visit: Payer: Self-pay

## 2024-11-18 NOTE — Telephone Encounter (Signed)
 FYI Only or Action Required?: FYI only for provider: appointment scheduled on 11/19/24 at [facility]  Patient was last seen in primary care on 09/11/2024 by [name], MD.  Called Nurse Triage reporting Vaginal Itching.  Symptoms began several days ago.  Interventions attempted: Nothing.  Symptoms are: stable.  Triage Disposition: See PCP When Office is Open (Within 3 Days)  Patient/caregiver understands and will follow disposition?: Yes   Reason for Disposition  [1] Symptoms of a yeast infection (i.e., itchy, white discharge, not bad smelling) AND [2] not improved > 3 days following Care Advice  Answer Assessment - Initial Assessment Questions Patient reports drinking a lot of soda, recently shaved and experiences itching post shaving sometimes. Patient takes gummies that help support vaginal pH, been taking it since beginning of December. Not established with a GYN.   1. SYMPTOM: What's the main symptom you're concerned about? (e.g., pain, itching, dryness)     Vaginal itching  2. LOCATION: Where is the itching located? (e.g., inside/outside, left/right)     Entrance of vagina  3. ONSET: When did the itching start?     4 days ago  4. PAIN: Is there any pain? If Yes, ask: How bad is it? (Scale: 1-10; mild, moderate, severe)     Denies  5. ITCHING: Is there any itching? If Yes, ask: How bad is it? (Scale: 1-10; mild, moderate, severe)     Not constant 5/10  6. CAUSE: What do you think is causing the discharge? Have you had the same problem before? What happened then?     Unsure  7. OTHER SYMPTOMS: Do you have any other symptoms? (e.g., fever, itching, vaginal bleeding, pain with urination, injury to genital area, vaginal foreign body)     Notices white creamy discharge on the outside of vagina, but always has that  Protocols used: Vaginal Symptoms-A-AH  Copied from CRM #[id number]. Topic: Clinical - Red Word Triage >> Nov 18, 2024 11:07 AM [name] wrote: Pt says the top part of her vaginal area is itchy every once and a while. Started on Thursday. Pt is wanting to be seen by a female doc to get medication for it. Or if she can be advised of a type of cream or pill from [facility]. She says she had it before and she's been drinking a lot of soda.

## 2024-11-19 ENCOUNTER — Ambulatory Visit: Admitting: Nurse Practitioner

## 2024-11-25 ENCOUNTER — Telehealth: Payer: Self-pay | Admitting: Family Medicine

## 2024-11-25 NOTE — Telephone Encounter (Signed)
 Copied from CRM [id number]. Topic: Medical Record Request - Provider/Facility Request >> Nov 22, 2024  1:33 PM Donna BRAVO wrote: Reason for CRM: Jessica Hall patients mother [facility] Card  Benefits award card [employer] # [id number] phone number [phone]  [facility] needs medical records, medication  and therapist who she talks to  for utilities and everything else Complete verification release from doctor of her treatment. [email or web address]  [employer] stated they need this ASAP so patient can gets benefit back on her card  Please call patient mother Jessica Hall when this has been done  Patient was informed they will receive a call back by end of business day >> Nov 22, 2024  2:46 PM Tinnie C wrote: Mother returning call on this. Please call mother at [phone] once complete  >> Nov 22, 2024  2:12 PM Antonio DEL wrote: Correcting routing.  >> Nov 22, 2024  2:01 PM Delon B wrote: Routed to this office in error; not our patient or provider. Sent Error report to [facility]

## 2024-11-26 ENCOUNTER — Telehealth: Payer: Self-pay

## 2024-11-26 NOTE — Telephone Encounter (Signed)
 Copied from CRM #[id number]. Topic: General - Other >> Nov 26, 2024 12:59 PM Myrick T wrote: Reason for CRM: patient called to f/u on the verification form that needs to be sent to [facility]. Per patient please contact her when this has been done

## 2024-11-27 NOTE — Telephone Encounter (Addendum)
 I think this is an insurance issue. The 11-25-2024 message looks like a request for medical records. I have not received any forms for me to complete. I really have no idea what to do with this request. Maybe medical records or Lauraine know what they need.

## 2024-11-27 NOTE — Telephone Encounter (Signed)
 I don't have any information how to do that.

## 2024-11-27 NOTE — Telephone Encounter (Signed)
 Copied from CRM #[id number]. Topic: General - Other >> Nov 27, 2024  9:51 AM Shanda MATSU wrote: Patient's mother calling to check status of verf letter that needs to be sent to [facility], adv caller that req has been sent to provider, adv no update as of yet that provider has completed letter, caller is req a call back oncee completed.

## 2024-11-29 ENCOUNTER — Ambulatory Visit: Admitting: Family Medicine

## 2024-11-29 ENCOUNTER — Encounter: Payer: Self-pay | Admitting: Family Medicine

## 2024-11-29 VITALS — BP 120/85 | HR 70 | Ht 64.0 in | Wt 202.9 lb

## 2024-11-29 DIAGNOSIS — F320 Major depressive disorder, single episode, mild: Secondary | ICD-10-CM

## 2024-11-29 DIAGNOSIS — R5383 Other fatigue: Secondary | ICD-10-CM | POA: Diagnosis not present

## 2024-11-29 DIAGNOSIS — F419 Anxiety disorder, unspecified: Secondary | ICD-10-CM | POA: Diagnosis not present

## 2024-11-29 DIAGNOSIS — R6883 Chills (without fever): Secondary | ICD-10-CM | POA: Diagnosis not present

## 2024-11-29 MED ORDER — VENLAFAXINE HCL ER 75 MG PO CP24: 75.0000 mg | ORAL_CAPSULE | Freq: Every day | ORAL | 3 refills | Status: AC

## 2024-11-29 NOTE — Progress Notes (Signed)
" °  ° ° °  Established patient visit   Patient: Jessica Hall   DOB: 2001-07-28   24 y.o. Female  MRN: [id number] Visit Date: 11/29/2024  Today's healthcare provider: [name], MD   Chief Complaint  Patient presents with   Medication Management    Change anx/dep medication. Had [facility] visit 11/11/24, Effexor  was increased   Anemia    Would like to be tested for anemia. At times feels like she cold ongoing for a few days   Subjective    Discussed the use of AI scribe software for clinical note transcription with the patient, who gave verbal consent to proceed.  History of Present Illness   Jessica Hall is a 24 year old female who presents with concerns of possible anemia.  She has been experiencing intermittent cold chills and feeling cold over the past week, necessitating the use of a jacket and blanket. No associated illness has been noted.  She denies feeling sick, having trouble breathing, or experiencing unusual bleeding, including menstrual bleeding or blood in urine. However, she sometimes feels tired and lacks energy.  She is currently taking venlafaxine  37.5 mg and is awaiting an increase in dosage to 75 mg per IBH recommendations. She has about ten pills left of her current prescription and [facility].  There is a family history of anemia and thyroid problems, with her mother having thyroid issues.      Medications: Show/hide medication list[1]      Objective    BP 120/85   Pulse 70   Ht 5' 4 (1.626 m)   Wt 202 lb 14.4 oz (92 kg)   LMP 11/25/2024 (Approximate)   SpO2 100%   BMI 34.83 kg/m   Physical Exam   General appearance: Mildly obese female, cooperative and in no acute distress Head: Normocephalic, without obvious abnormality, atraumatic Respiratory: Respirations even and unlabored, normal respiratory rate Extremities: All extremities are intact.  Skin: Skin color, texture, turgor normal. No rashes seen  Psych: Appropriate mood and  affect. Neurologic: Mental status: Alert, oriented to person, place, and time, thought content appropriate.    Assessment & Plan        Evaluation of fatigue and cold intolerance Intermittent cold intolerance and fatigue. Differential includes thyroid dysfunction. - Ordered lab tests for anemia and thyroid function.  Major depressive disorder, single episode, mild and anxiety Currently on venlafaxine  37.5 mg. Awaiting pharmacy confirmation for 75 mg prescription. - Resent prescription for venlafaxine  75 mg to pharmacy. - Ensured pharmacy is aware of medication needs.         [name], MD  [facility] [phone] (phone) [phone] (fax)  [facility]    [1]  Outpatient Medications Prior to Visit  Medication Sig   dicyclomine  (BENTYL ) 10 MG capsule Take 1 capsule (10 mg total) by mouth 4 (four) times daily -  before meals and at bedtime.   Docusate Calcium (STOOL SOFTENER PO) Take by mouth.   famotidine  (PEPCID ) 20 MG tablet TAKE 1 TABLET BY MOUTH EVERY DAY   hydrOXYzine  (ATARAX ) 10 MG tablet Bollig take 1 tablet (10 mg total) by mouth 3 (three) times daily as needed. Mooradian also take 2-3 tablets (20-30 mg total) at bedtime as needed.   [DISCONTINUED] venlafaxine  XR (EFFEXOR -XR) 75 MG 24 hr capsule Take 1 capsule (75 mg total) by mouth daily with breakfast.   No facility-administered medications prior to visit.   "

## 2024-11-30 LAB — CBC
Hematocrit: 42.8 % (ref 34.0–46.6)
Hemoglobin: 13.9 g/dL (ref 11.1–15.9)
MCH: 28.2 pg (ref 26.6–33.0)
MCHC: 32.5 g/dL (ref 31.5–35.7)
MCV: 87 fL (ref 79–97)
Platelets: 206 x10E3/uL (ref 150–450)
RBC: 4.93 x10E6/uL (ref 3.77–5.28)
RDW: 13 % (ref 11.7–15.4)
WBC: 4.9 x10E3/uL (ref 3.4–10.8)

## 2024-11-30 LAB — TSH: TSH: 0.595 u[IU]/mL (ref 0.450–4.500)

## 2024-12-01 ENCOUNTER — Ambulatory Visit: Payer: Self-pay | Admitting: Family Medicine

## 2024-12-03 NOTE — Telephone Encounter (Signed)
 Form (SSBCI) from [facility] was completed by Dr. Gasper and faxed 12/03/24.

## 2024-12-04 NOTE — Telephone Encounter (Signed)
 Patient advised.

## 2024-12-09 ENCOUNTER — Ambulatory Visit: Admitting: Licensed Clinical Social Worker

## 2024-12-09 DIAGNOSIS — F330 Major depressive disorder, recurrent, mild: Secondary | ICD-10-CM

## 2024-12-09 NOTE — BH Specialist Note (Unsigned)
 Integrated Behavioral Health Follow Up In-Person Visit 12/09/24  MRN: [id number] Name: Jessica Hall  Number of Integrated Behavioral Health Clinician visits: 4- Fourth Visit  Session Start time: 1530   Session End time: 1600  Total time in minutes: 30    Types of Service: Collaborative care   Subjective: Presenting Problem/Current Symptoms: Jessica Hall is a 24 y.o. female with history of PTSD, depression, anxiety seen in consultation at the request of Jessica Perry MD for establishment of [facility] management.   Pt is currently taking the following psychiatric medications: venlafaxine  37.5 mg .  Current symptoms include: Poor appetite, feeling bad about self, trouble focusing and concentrating, suicidal ideation (with no plans or intent to follow through), feeling nervous/anxious/on the edge, worrying, restlessness, irritability, insomnia at times. Pt has experienced significant traumatic events in childhood/teen years. Pt reports passive SI 3 months ago with no plans or intent to follow through.  Patient denies HI, or AVH at time of session. Pt denies substance use.   Patient reports the following symptoms/concerns: continuing symptoms of depression and anxiety--more anxiety than depression since last session. Decreased appetite.  Duration of problem: ongoing; Severity of problem: moderate  Objective: Mood: Anxious and Depressed and Affect: Depressed Risk of harm to self or others: No plan to harm self or others  Life Context: Family and Social: Patient reports that she has been getting along with mother well.  Patient reports that she is getting along with her boyfriend well.  Patient denies any conflict or concerns at time of session.  Medication compliance: Patient reports that she is compliant with her medication and is not having any issues/side effects at time of session.  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   Self-Care: Pt has been  intentional about incorporating behavioral activation strategies into her daily routine. Pt goes to the gym every day.  Patient reports that she is intentional about going out, getting hair done, getting nails done.  Patient reports this makes her feel better about herself  Life Changes: Pt denies any significant changes since last session.   Patient and/or Family's Strengths/Protective Factors: Social connections, Social and Patent attorney, Concrete supports in place (healthy food, safe environments, etc.), Sense of purpose, and Physical Health (exercise, healthy diet, medication compliance, etc.)  Goals Addressed: Patient will:  Reduce symptoms of: anxiety, depression, and insomnia   Increase knowledge and/or ability of: coping skills, healthy habits, self-management skills, and stress reduction   Demonstrate ability to: Increase healthy adjustment to current life circumstances  Progress towards Goals: Ongoing  Interventions: Interventions utilized:  Mindfulness or Relaxation Training, Behavioral Activation, Medication Monitoring, and Supportive Counseling Standardized Assessments completed: C-SSRS Short, GAD-7, and PHQ 9     12/09/2024    3:52 PM 11/29/2024    3:36 PM 11/11/2024    4:12 PM  Depression screen PHQ 2/9  Decreased Interest 0 0 0  Down, Depressed, Hopeless 1 1 0  PHQ - 2 Score 1 1 0  Altered sleeping 1 1 2   Tired, decreased energy 1 1 2   Change in appetite 1 1 2   Feeling bad or failure about yourself  0 1 0  Trouble concentrating 0 1 0  Moving slowly or fidgety/restless 0 0 0  Suicidal thoughts 0 0 0  PHQ-9 Score 4 6 6   Difficult doing work/chores  Not difficult at all       12/09/2024    3:53 PM 11/29/2024    3:37 PM 11/11/2024  4:12 PM 10/14/2024    3:13 PM  GAD 7 : Generalized Anxiety Score  Nervous, Anxious, on Edge 0 0  2  2   Control/stop worrying 0 0  2  0   Worry too much - different things 0 0  2  2   Trouble relaxing 0 0  2  2   Restless 0  0  0  2   Easily annoyed or irritable 0 0  2  3   Afraid - awful might happen 0 0  0  0   Total GAD 7 Score 0 0 10 11  Anxiety Difficulty  Not difficult at all Not difficult at all Very difficult     Data saved with a previous flowsheet row definition     [facility] from 12/09/2024 in [facility] Integrated Behavioral Health from 11/11/2024 in [facility] Integrated Behavioral Health from 10/14/2024 in [facility]  C-SSRS RISK CATEGORY No Risk Low Risk Low Risk    Patient and/or Family Response: Patient reports that she feels that she is progressing well and does not feel depressed or overly anxious at time of session.  Patient reports that she is sleeping well.  Patient Centered Plan: Patient is on the following Treatment Plan(s):  1. Recommend increasing Effexor  37.5 mg daily to 75 mg daily 2. Recommend hydroxyzine  10 mg TID PRN anxiety and 20-30 mg at bedtime PRN sleep 3. Recommend a vitamin D level 4. BH specialist to follow up.  Clinical Assessment/Diagnosis Encounter Diagnosis  Name Primary?   MDD (major depressive disorder), recurrent episode, mild Yes    Assessment: IBH clinician reviewed Marielena Harvell Chevez 's PHQ-9, GAD-7, and CSSRS scores. Scores reflect ongoing symptoms related to depression and anxiety. IBH clinician reviewed current external stressors that pt is experiencing and reviewed current coping skills that patient is using and provided additional recommendations of new coping skills that could benefit. IBH clinician provided pt with psychoeducational materials in after visit summary, and instructed pt to review materials through MyChart. IBH clinician encouraging continued [facility] Collaborative Care Team supports including counseling and medication management.    Patient Maltese benefit from ongoing IBH supports.  Plan: Follow up with behavioral health clinician  on : 01/06/25 Behavioral recommendations:  Medication compliance Continue behavioral activation recommendations Review CBT/Mindfulness suggestions Self Care daily--including awareness about eating each day Referral(s): Integrated [employer] (In Clinic)  Antelope R Sharif Rendell, LCSW

## 2024-12-11 NOTE — Patient Instructions (Signed)
??   Self-Care Worksheet  Always prioritize medication compliance--if you are prescribed medications, please take medication as prescribed.   ?? Mental & Emotional Self-Care Practice mindfulness or meditation for 5-10 minutes daily Journal your thoughts or feelings Set boundaries to protect your emotional energy Talk to a therapist or counselor Engage in positive self-talk  ?? Physical Self-Care Get 7-9 hours of sleep each night Move your body (walk, stretch, dance, exercise) Eat nourishing meals and stay hydrated Take breaks to rest and recharge Schedule regular health check-ups  ?? Creative Self-Care Try a new hobby or craft Listen to or play music Draw, paint, or color Write poetry or stories Bluford or bake something new  ?? Social Self-Care Spend time with supportive friends or family Join a group or community with shared interests Say no when you need to Reach out to someone you trust Plan a fun outing or virtual hangout  ?? Spiritual Self-Care Reflect on your values and beliefs Spend time in nature Read spiritual or inspirational texts Practice gratitude daily Attend a place of worship or spiritual gathering  ?? Environmental Self-Care Adult Nurse a calming corner or sanctuary Use scents, lighting, or music to set a mood Organize your schedule or workspace Reduce digital clutter

## 2024-12-23 ENCOUNTER — Ambulatory Visit: Admitting: Family Medicine

## 2024-12-23 DIAGNOSIS — F900 Attention-deficit hyperactivity disorder, predominantly inattentive type: Secondary | ICD-10-CM

## 2024-12-23 DIAGNOSIS — K2100 Gastro-esophageal reflux disease with esophagitis, without bleeding: Secondary | ICD-10-CM

## 2024-12-23 DIAGNOSIS — G40909 Epilepsy, unspecified, not intractable, without status epilepticus: Secondary | ICD-10-CM

## 2024-12-23 DIAGNOSIS — Z124 Encounter for screening for malignant neoplasm of cervix: Secondary | ICD-10-CM

## 2024-12-23 DIAGNOSIS — K581 Irritable bowel syndrome with constipation: Secondary | ICD-10-CM

## 2024-12-23 DIAGNOSIS — Z23 Encounter for immunization: Secondary | ICD-10-CM

## 2024-12-23 DIAGNOSIS — F419 Anxiety disorder, unspecified: Secondary | ICD-10-CM

## 2024-12-25 ENCOUNTER — Encounter: Payer: Self-pay | Admitting: Family Medicine

## 2024-12-25 ENCOUNTER — Ambulatory Visit: Admitting: Family Medicine

## 2024-12-25 VITALS — BP 112/85 | HR 75 | Resp 16 | Ht 64.0 in | Wt 198.0 lb

## 2024-12-25 DIAGNOSIS — K2100 Gastro-esophageal reflux disease with esophagitis, without bleeding: Secondary | ICD-10-CM

## 2024-12-25 DIAGNOSIS — K581 Irritable bowel syndrome with constipation: Secondary | ICD-10-CM

## 2024-12-25 DIAGNOSIS — F900 Attention-deficit hyperactivity disorder, predominantly inattentive type: Secondary | ICD-10-CM

## 2024-12-25 DIAGNOSIS — Z3009 Encounter for other general counseling and advice on contraception: Secondary | ICD-10-CM

## 2024-12-25 DIAGNOSIS — G40909 Epilepsy, unspecified, not intractable, without status epilepticus: Secondary | ICD-10-CM

## 2024-12-25 DIAGNOSIS — F913 Oppositional defiant disorder: Secondary | ICD-10-CM

## 2024-12-25 DIAGNOSIS — F32A Depression, unspecified: Secondary | ICD-10-CM

## 2024-12-25 DIAGNOSIS — Z124 Encounter for screening for malignant neoplasm of cervix: Secondary | ICD-10-CM

## 2024-12-25 LAB — POCT URINE PREGNANCY: Preg Test, Ur: NEGATIVE

## 2024-12-25 MED ORDER — NORETHINDRONE ACET-ETHINYL EST 1-20 MG-MCG PO TABS: 1.0000 | ORAL_TABLET | Freq: Every day | ORAL | 5 refills | Status: AC

## 2024-12-25 NOTE — Progress Notes (Signed)
" °  ° ° °  Established patient visit   Patient: Channel Papandrea Kozub   DOB: [birth year]   24 y.o. Female  MRN: [id number] Visit Date: 12/25/2024  Today's healthcare provider: [name], MD   Chief Complaint  Patient presents with   Follow-up    Birth control   Subjective    Discussed the use of AI scribe software for clinical note transcription with the patient, who gave verbal consent to proceed.  History of Present Illness   Karolyne Timmons Degraffenreid is a 24 year old female who presents with a request to start birth control pills for contraception.  She is interested in starting birth control pills primarily for contraception and not due to any menstrual problems. Her last menstrual period ended on Sunday, December 22, 2024.  She mentioned a friend recommended Slynd, a progesterone-only pill, but she is open to other options. She plans to start the birth control pill today or tomorrow and intends to take it at 7 AM daily.       Medications: Show/hide medication list[1] Review of Systems     Objective    BP 112/85 (BP Location: Right Arm, Patient Position: Sitting, Cuff Size: Normal)   Pulse 75   Resp 16   Ht 5' 4 (1.626 m)   Wt 198 lb (89.8 kg)   LMP 11/25/2024 (Approximate)   SpO2 99%   BMI 33.99 kg/m     Results for orders placed or performed in visit on 12/25/24  POCT urine pregnancy  Result Value Ref Range   Preg Test, Ur Negative Negative    Assessment & Plan       Contraceptive management She desired oral contraceptive pills. Discussed side effects including nausea and rare risk of blood clots. Emphasized daily intake and adherence. Combination pills have a low failure rate with consistent use. - Prescribed low-dose combination oral contraceptive pill (20 mg estrogen). - Advised to start the pill today or tomorrow, taking it at the same time daily. - Instructed on managing missed doses: take two pills the next day if one is missed, and two pills for two consecutive days  if two are missed. - Advised to continue taking placebo pills to maintain routine. - Discussed potential side effects and importance of consistent use.     Refer to gyn for routine pap/pelvic     [name], MD  [facility] [phone] (phone) [phone] (fax)  [facility]    [1]  Outpatient Medications Prior to Visit  Medication Sig   dicyclomine  (BENTYL ) 10 MG capsule Take 1 capsule (10 mg total) by mouth 4 (four) times daily -  before meals and at bedtime.   Docusate Calcium (STOOL SOFTENER PO) Take by mouth.   famotidine  (PEPCID ) 20 MG tablet TAKE 1 TABLET BY MOUTH EVERY DAY   hydrOXYzine  (ATARAX ) 10 MG tablet Apolinar take 1 tablet (10 mg total) by mouth 3 (three) times daily as needed. Taber also take 2-3 tablets (20-30 mg total) at bedtime as needed.   venlafaxine  XR (EFFEXOR -XR) 75 MG 24 hr capsule Take 1 capsule (75 mg total) by mouth daily with breakfast.   No facility-administered medications prior to visit.   "

## 2025-01-06 ENCOUNTER — Ambulatory Visit: Admitting: Licensed Clinical Social Worker

## 2025-11-18 ENCOUNTER — Ambulatory Visit
